# Patient Record
Sex: Male | Born: 1945
Health system: Southern US, Community
[De-identification: ages and names within clinical notes are randomized; demographics above are authoritative.]

## PROBLEM LIST (undated history)

## (undated) DIAGNOSIS — R0602 Shortness of breath: Secondary | ICD-10-CM

## (undated) DIAGNOSIS — K219 Gastro-esophageal reflux disease without esophagitis: Secondary | ICD-10-CM

## (undated) DIAGNOSIS — D649 Anemia, unspecified: Secondary | ICD-10-CM

## (undated) DIAGNOSIS — R42 Dizziness and giddiness: Secondary | ICD-10-CM

## (undated) DIAGNOSIS — H269 Unspecified cataract: Secondary | ICD-10-CM

## (undated) DIAGNOSIS — G2581 Restless legs syndrome: Secondary | ICD-10-CM

## (undated) DIAGNOSIS — J3089 Other allergic rhinitis: Secondary | ICD-10-CM

## (undated) DIAGNOSIS — E785 Hyperlipidemia, unspecified: Secondary | ICD-10-CM

## (undated) DIAGNOSIS — G4730 Sleep apnea, unspecified: Secondary | ICD-10-CM

## (undated) DIAGNOSIS — R519 Headache, unspecified: Secondary | ICD-10-CM

## (undated) DIAGNOSIS — M549 Dorsalgia, unspecified: Secondary | ICD-10-CM

## (undated) DIAGNOSIS — G43909 Migraine, unspecified, not intractable, without status migrainosus: Secondary | ICD-10-CM

## (undated) DIAGNOSIS — G4733 Obstructive sleep apnea (adult) (pediatric): Principal | ICD-10-CM

## (undated) DIAGNOSIS — K449 Diaphragmatic hernia without obstruction or gangrene: Secondary | ICD-10-CM

## (undated) DIAGNOSIS — I10 Essential (primary) hypertension: Secondary | ICD-10-CM

## (undated) DIAGNOSIS — G4700 Insomnia, unspecified: Secondary | ICD-10-CM

## (undated) DIAGNOSIS — R12 Heartburn: Secondary | ICD-10-CM

## (undated) DIAGNOSIS — F419 Anxiety disorder, unspecified: Secondary | ICD-10-CM

## (undated) DIAGNOSIS — M7989 Other specified soft tissue disorders: Secondary | ICD-10-CM

## (undated) DIAGNOSIS — IMO0001 Reserved for inherently not codable concepts without codable children: Secondary | ICD-10-CM

## (undated) HISTORY — PX: NASAL SINUS SURGERY: SHX719

## (undated) HISTORY — DX: Headache, unspecified: R51.9

## (undated) HISTORY — PX: TONSILLECTOMY AND ADENOIDECTOMY: SUR1326

## (undated) HISTORY — DX: Insomnia, unspecified: G47.00

## (undated) HISTORY — DX: Gastro-esophageal reflux disease without esophagitis: K21.9

## (undated) HISTORY — DX: Heartburn: R12

## (undated) HISTORY — DX: Dorsalgia, unspecified: M54.9

## (undated) HISTORY — DX: Sleep apnea, unspecified: G47.30

## (undated) HISTORY — DX: Other allergic rhinitis: J30.89

## (undated) HISTORY — DX: Other specified soft tissue disorders: M79.89

## (undated) HISTORY — DX: Essential (primary) hypertension: I10

## (undated) HISTORY — DX: Anxiety disorder, unspecified: F41.9

## (undated) HISTORY — DX: Diaphragmatic hernia without obstruction or gangrene: K44.9

## (undated) HISTORY — PX: FOREARM SURGERY: SHX651

## (undated) HISTORY — DX: Shortness of breath: R06.02

## (undated) HISTORY — DX: Hyperlipidemia, unspecified: E78.5

## (undated) HISTORY — DX: Reserved for inherently not codable concepts without codable children: IMO0001

## (undated) HISTORY — DX: Dizziness and giddiness: R42

## (undated) HISTORY — DX: Unspecified cataract: H26.9

## (undated) HISTORY — DX: Obstructive sleep apnea (adult) (pediatric): G47.33

## (undated) HISTORY — PX: ROTATOR CUFF REPAIR: SHX139

## (undated) HISTORY — DX: Anemia, unspecified: D64.9

## (undated) HISTORY — DX: Migraine, unspecified, not intractable, without status migrainosus: G43.909

## (undated) HISTORY — DX: Restless legs syndrome: G25.81

---

## 2012-11-19 ENCOUNTER — Other Ambulatory Visit: Payer: Self-pay | Admitting: Internal Medicine

## 2012-11-19 DIAGNOSIS — M7989 Other specified soft tissue disorders: Secondary | ICD-10-CM

## 2012-11-20 ENCOUNTER — Ambulatory Visit
Admission: RE | Admit: 2012-11-20 | Discharge: 2012-11-20 | Disposition: A | Discharge status: Home / Self Care | Payer: Medicare Other | Source: Ambulatory Visit | Attending: Internal Medicine | Admitting: Internal Medicine

## 2012-11-20 DIAGNOSIS — M7989 Other specified soft tissue disorders: Secondary | ICD-10-CM

## 2013-08-19 ENCOUNTER — Other Ambulatory Visit: Payer: Self-pay | Admitting: Internal Medicine

## 2013-08-19 DIAGNOSIS — Z139 Encounter for screening, unspecified: Secondary | ICD-10-CM

## 2013-08-22 ENCOUNTER — Other Ambulatory Visit: Payer: Medicare Other

## 2013-08-22 ENCOUNTER — Ambulatory Visit
Admission: RE | Admit: 2013-08-22 | Discharge: 2013-08-22 | Disposition: A | Discharge status: Home / Self Care | Payer: Medicare HMO | Source: Ambulatory Visit | Attending: Internal Medicine | Admitting: Internal Medicine

## 2013-08-22 DIAGNOSIS — Z139 Encounter for screening, unspecified: Secondary | ICD-10-CM

## 2013-10-28 ENCOUNTER — Other Ambulatory Visit: Payer: Self-pay | Admitting: Internal Medicine

## 2013-10-28 DIAGNOSIS — M7989 Other specified soft tissue disorders: Secondary | ICD-10-CM

## 2013-11-06 ENCOUNTER — Ambulatory Visit (INDEPENDENT_AMBULATORY_CARE_PROVIDER_SITE_OTHER): Payer: Medicare HMO | Admitting: Cardiology

## 2013-11-06 ENCOUNTER — Encounter: Payer: Self-pay | Admitting: Cardiology

## 2013-11-06 ENCOUNTER — Telehealth (HOSPITAL_COMMUNITY): Payer: Self-pay

## 2013-11-06 VITALS — BP 116/74 | HR 62 | Ht 72.0 in | Wt 189.1 lb

## 2013-11-06 DIAGNOSIS — R079 Chest pain, unspecified: Secondary | ICD-10-CM

## 2013-11-06 NOTE — Patient Instructions (Addendum)
The current medical regimen is effective;  continue present plan and medications.  Your physician has requested that you have a lexiscan myoview. For further information please visit [email or web address]. Please follow instruction sheet, as given.  Follow up will be based on these results 

## 2013-11-06 NOTE — Progress Notes (Signed)
HPI The patient presents for evaluation of chest discomfort. He has no past cardiac history although he does report a distant history of stress testing years ago. He reports that he's had chest discomfort over the past few days.  He reports that this happened Friday when he was doing yard work. It was warm. However, he says he had some excessive diaphoresis but he was doing. He subsequently had some chest discomfort. As a tightness. It was mid sternal. There was no radiation to his jaw or to his arms. He was somewhat short of breath. He stopped what he was doing. He did improve. He had another episode of this on Tuesday when he was golfing. He was much more diaphoretic he thought he should have been. There was some chest discomfort. He hasn't had any other than those 2 episodes. He has otherwise felt well. He was able to hit golf balls at the driving range on Monday which she thought was exerting and he did not get symptoms with this. He has never had these symptoms before. He has not had any resting complaints and denies any PND or orthopnea. He denies any palpitations, presyncope or syncope.   Current Outpatient Prescriptions  Medication Sig Dispense Refill  . atorvastatin (LIPITOR) 20 MG tablet Take 20 mg by mouth daily.      Marland Kitchen azelastine (ASTELIN) 0.1 % nasal spray Place 1 spray into both nostrils 2 (two) times daily. Use in each nostril as directed      . irbesartan (AVAPRO) 150 MG tablet Take 150 mg by mouth daily.      Marland Kitchen omeprazole (PRILOSEC) 20 MG capsule Take 20 mg by mouth every other day.      . pramipexole (MIRAPEX) 0.5 MG tablet Take 0.5 mg by mouth 2 (two) times daily.      . sertraline (ZOLOFT) 50 MG tablet Take 50 mg by mouth daily.       No current facility-administered medications for this visit.    Past Medical History  Diagnosis Date  . Hyperlipidemia   . Reflux     Past Surgical History  Procedure Laterality Date  . Rotator cuff repair      right  . Tonsillectomy and  adenoidectomy      Family History  Problem Relation Age of Onset  . Adopted: Yes    History   Social History  . Marital Status: Married    Spouse Name: N/A    Number of Children: 2  . Years of Education: N/A   Occupational History  . Not on file.   Social History Main Topics  . Smoking status: Never Smoker   . Smokeless tobacco: Not on file  . Alcohol Use: Not on file  . Drug Use: Not on file  . Sexual Activity: Not on file   Other Topics Concern  . Not on file   Social History Narrative   Retired.  Part time driver for [employer].  Lives at home with wife.     ROS:  Positive for occasional dizziness, vertigo, reflux. Otherwise as stated in the HPI and negative for all other systems.  PHYSICAL EXAM BP 116/74  Pulse 62  Ht 6' (1.829 m)  Wt 189 lb 1.9 oz (85.784 kg)  BMI 25.64 kg/m2 GENERAL:  Well appearing HEENT:  Pupils equal round and reactive, fundi not visualized, oral mucosa unremarkable NECK:  No jugular venous distention, waveform within normal limits, carotid upstroke brisk and symmetric, no bruits, no thyromegaly LYMPHATICS:  No  cervical, inguinal adenopathy LUNGS:  Clear to auscultation bilaterally BACK:  No CVA tenderness CHEST:  Unremarkable HEART:  PMI not displaced or sustained,S1 and S2 within normal limits, no S3, no S4, no clicks, no rubs, no murmurs ABD:  Flat, positive bowel sounds normal in frequency in pitch, no bruits, no rebound, no guarding, no midline pulsatile mass, no hepatomegaly, no splenomegaly EXT:  2 plus pulses throughout, no edema, no cyanosis no clubbing SKIN:  No rashes no nodules NEURO:  Cranial nerves II through XII grossly intact, motor grossly intact throughout PSYCH:  Cognitively intact, oriented to person place and time   EKG:  Sinus rhythm, rate 70,  axis within normal limits, intervals within normal limits, no acute ST-T wave changes.  11/05/2013  ASSESSMENT AND PLAN  CHEST PAIN:    The description of his discomfort is  concerning for obstructive coronary disease. He does not have severe risk factors although we don't know his family history. Given this the pretest probability of obstructive coronary disease is moderately high. Therefore, he needs to be added sensitivity and specificity of perfusion imaging. I will schedule this or an exercise echo.  The possibility for a false-negative exercise treadmill test alone he is too high and would put this patient danger of missing obstructive coronary disease.   HYPERLIPIDEMIA:  I do not have a would be happy to review these.  For now I will defer to No primary provider on file.

## 2013-11-07 ENCOUNTER — Encounter: Payer: Self-pay | Admitting: *Deleted

## 2013-11-07 ENCOUNTER — Encounter (HOSPITAL_COMMUNITY): Payer: Self-pay | Admitting: Pharmacy Technician

## 2013-11-07 ENCOUNTER — Telehealth: Payer: Self-pay | Admitting: Cardiology

## 2013-11-07 ENCOUNTER — Ambulatory Visit (HOSPITAL_COMMUNITY)
Admission: RE | Admit: 2013-11-07 | Discharge: 2013-11-07 | Disposition: A | Discharge status: Home / Self Care | Payer: Medicare HMO | Source: Ambulatory Visit | Attending: Cardiovascular Disease | Admitting: Cardiovascular Disease

## 2013-11-07 DIAGNOSIS — R079 Chest pain, unspecified: Secondary | ICD-10-CM

## 2013-11-07 DIAGNOSIS — Z01810 Encounter for preprocedural cardiovascular examination: Secondary | ICD-10-CM

## 2013-11-07 DIAGNOSIS — R9439 Abnormal result of other cardiovascular function study: Secondary | ICD-10-CM

## 2013-11-07 MED ORDER — TECHNETIUM TC 99M SESTAMIBI GENERIC - CARDIOLITE
30.9000 | Freq: Once | INTRAVENOUS | Status: AC | PRN
  Administered 2013-11-07: 30.9 via INTRAVENOUS

## 2013-11-07 MED ORDER — TECHNETIUM TC 99M SESTAMIBI GENERIC - CARDIOLITE
10.8000 | Freq: Once | INTRAVENOUS | Status: AC | PRN
  Administered 2013-11-07: 11 via INTRAVENOUS

## 2013-11-07 NOTE — Telephone Encounter (Signed)
New message     Had nuclear stress test today----can he go to the beach on Sunday even if he does not get the results back today?

## 2013-11-07 NOTE — Procedures (Addendum)
[facility] [address] [place] [address] [place] [address] [phone]  Cardiology Nuclear Med Study  [name] is a 68 y.o. male     MRN : [id number]     DOB: 1946/04/21  Procedure Date: 11/07/2013  Nuclear Med Background Indication for Stress Test:  Evaluation for Ischemia History:  No prior cardiac or respiratory history reported. No prior NUC MPI for comparison. Cardiac Risk Factors: Lipids  Symptoms:  Chest Pain, Dizziness, DOE, Light-Headedness and Palpitations   Nuclear Pre-Procedure Caffeine/Decaff Intake:  7:00pm NPO After: 5:00am   IV Site: R Forearm  IV 0.9% NS with Angio Cath:  22g  Chest Size (in):  46"  IV Started by: Rolene Course, RN  Height: 6' (1.829 m)  Cup Size: n/a  BMI:  Body mass index is 25.63 kg/(m^2). Weight:  189 lb (85.73 kg)   Tech Comments:  n/a    Nuclear Med Study 1 or 2 day study: 1 day  Stress Test Type:  Stress  Order Authorizing Provider:  Minus Breeding, MD   Resting Radionuclide: Technetium 95m Sestamibi  Resting Radionuclide Dose: 10.8 mCi   Stress Radionuclide:  Technetium 72m Sestamibi  Stress Radionuclide Dose: 30.9 mCi           Stress Protocol Rest HR: 67 Stress HR: 153  Rest BP: 113/83 Stress BP: 172/76  Exercise Time (min): 9:30 METS: 10.9   Predicted Max HR: 151 bpm % Max HR: 101.32 bpm Rate Pressure Product: 68127  Dose of Adenosine (mg):  n/a Dose of Lexiscan: n/a mg  Dose of Atropine (mg): n/a Dose of Dobutamine: n/a mcg/kg/min (at max HR)  Stress Test Technologist: Leane Para, CCT Nuclear Technologist: Imagene Riches, CNMT   Rest Procedure:  Myocardial perfusion imaging was performed at rest 45 minutes following the intravenous administration of Technetium 68m Sestamibi. Stress Procedure:  The patient performed treadmill exercise using a Bruce  Protocol for 9:30 minutes. The patient stopped due to SOB and Leg discomfort and denied any chest pain.  There were no  significant ST-T wave changes.  Technetium 42m Sestamibi was injected IV at peak exercise and myocardial perfusion imaging was performed after a brief delay.  Transient Ischemic Dilatation (Normal <1.22):  0.77 Lung/Heart Ratio (Normal <0.45):  0.22 QGS EDV:  85 ml QGS ESV:  34 ml LV Ejection Fraction: 60%  Rest ECG: NSR - Normal EKG  Stress ECG: Significant ST abnormalities consistent with ischemia. 1.5 mm horizontal inferolateral ST depression  QPS Raw Data Images:  Normal; no motion artifact; normal heart/lung ratio. Stress Images:  There is decreased uptake in the basal and mid segments of the inferior septum. Rest Images:  Normal homogeneous uptake in all areas of the myocardium. Subtraction (SDS):  These findings are consistent with ischemia. LV Wall Motion:  NL LV Function; NL Wall Motion  Impression Exercise Capacity:  Good exercise capacity. BP Response:  Normal blood pressure response. Clinical Symptoms:  No significant symptoms noted. ECG Impression:  Significant ST abnormalities consistent with ischemia. Comparison with Prior Nuclear Study: No previous nuclear study performed   Overall Impression:   Intermediate risk stress nuclear study with a relatively small area of mild inferoseptal ischemia.    Sanda Klein, MD  11/07/2013 11:45 AM

## 2013-11-07 NOTE — Telephone Encounter (Signed)
Reviewed results of nuc study with Dr Percival Spanish - pt needs a left heart cath one day next week.  Pt is aware.  He will not go to the beach and is aware I will call back with a date,time and instructions for the heart cath.

## 2013-11-07 NOTE — Telephone Encounter (Signed)
Pt aware he has been scheduled for a cardiac cath on Tuesday at [facility].  He will come into the office to pick up instructions and to have blood work.

## 2013-11-09 ENCOUNTER — Emergency Department (HOSPITAL_COMMUNITY): Payer: Medicare HMO

## 2013-11-09 ENCOUNTER — Emergency Department (HOSPITAL_COMMUNITY)
Admission: EM | Admit: 2013-11-09 | Discharge: 2013-11-09 | Disposition: A | Discharge status: Home / Self Care | Payer: Medicare HMO | Attending: Emergency Medicine | Admitting: Emergency Medicine

## 2013-11-09 ENCOUNTER — Telehealth: Payer: Self-pay | Admitting: Physician Assistant

## 2013-11-09 ENCOUNTER — Encounter (HOSPITAL_COMMUNITY): Payer: Self-pay | Admitting: Emergency Medicine

## 2013-11-09 DIAGNOSIS — R002 Palpitations: Secondary | ICD-10-CM | POA: Insufficient documentation

## 2013-11-09 DIAGNOSIS — R42 Dizziness and giddiness: Secondary | ICD-10-CM | POA: Insufficient documentation

## 2013-11-09 DIAGNOSIS — R55 Syncope and collapse: Secondary | ICD-10-CM

## 2013-11-09 DIAGNOSIS — E785 Hyperlipidemia, unspecified: Secondary | ICD-10-CM | POA: Insufficient documentation

## 2013-11-09 DIAGNOSIS — IMO0002 Reserved for concepts with insufficient information to code with codable children: Secondary | ICD-10-CM | POA: Insufficient documentation

## 2013-11-09 DIAGNOSIS — R5381 Other malaise: Secondary | ICD-10-CM | POA: Insufficient documentation

## 2013-11-09 DIAGNOSIS — R5383 Other fatigue: Secondary | ICD-10-CM

## 2013-11-09 DIAGNOSIS — Z79899 Other long term (current) drug therapy: Secondary | ICD-10-CM | POA: Insufficient documentation

## 2013-11-09 DIAGNOSIS — K219 Gastro-esophageal reflux disease without esophagitis: Secondary | ICD-10-CM | POA: Insufficient documentation

## 2013-11-09 LAB — CBC
HCT: 37.8 % — ABNORMAL LOW (ref 39.0–52.0)
Hemoglobin: 13 g/dL (ref 13.0–17.0)
MCH: 32.3 pg (ref 26.0–34.0)
MCHC: 34.4 g/dL (ref 30.0–36.0)
MCV: 94 fL (ref 78.0–100.0)
Platelets: 262 10*3/uL (ref 150–400)
RBC: 4.02 MIL/uL — ABNORMAL LOW (ref 4.22–5.81)
RDW: 12.8 % (ref 11.5–15.5)
WBC: 5.7 10*3/uL (ref 4.0–10.5)

## 2013-11-09 LAB — BASIC METABOLIC PANEL
BUN: 40 mg/dL — AB (ref 6–23)
CALCIUM: 9.3 mg/dL (ref 8.4–10.5)
CO2: 27 mEq/L (ref 19–32)
CREATININE: 1.4 mg/dL — AB (ref 0.50–1.35)
Chloride: 100 mEq/L (ref 96–112)
GFR, EST AFRICAN AMERICAN: 58 mL/min — AB (ref >90)
GFR, EST NON AFRICAN AMERICAN: 50 mL/min — AB (ref >90)
Glucose, Bld: 87 mg/dL (ref 70–99)
POTASSIUM: 3.9 meq/L (ref 3.7–5.3)
Sodium: 141 mEq/L (ref 137–147)

## 2013-11-09 LAB — TROPONIN I: Troponin I: <0.3 ng/mL (ref <0.30)

## 2013-11-09 LAB — I-STAT TROPONIN, ED: Troponin i, poc: 0 ng/mL (ref 0.00–0.08)

## 2013-11-09 MED ORDER — SODIUM CHLORIDE 0.9 % IV BOLUS (SEPSIS)
1000.0000 mL | Freq: Once | INTRAVENOUS | Status: AC
  Administered 2013-11-09: 1000 mL via INTRAVENOUS

## 2013-11-09 NOTE — Telephone Encounter (Signed)
The patient called to report that while at church he became dizzy.  This lasted for about an hour and resolved.  BP while we spoke was 117/87.   He had no CP or SOB during this period.  He has a LHC scheduled for Tuesday because a Positive Nuc last week(intermdeiate risk).  EF on the study was 60%.  I suggested he go to the ER if it happens again and also repeat his BP.  Tarri Fuller, [facility]

## 2013-11-09 NOTE — Discharge Instructions (Signed)
Near-Syncope Followup on Tuesday as scheduled for catheterization. Cut your avalide dose in half.  Return to the ED if you develop new or worsening symptoms. Near-syncope (commonly known as near fainting) is sudden weakness, dizziness, or feeling like you might pass out. During an episode of near-syncope, you may also develop pale skin, have tunnel vision, or feel sick to your stomach (nauseous). Near-syncope may occur when getting up after sitting or while standing for a long time. It is caused by a sudden decrease in blood flow to the brain. This decrease can result from various causes or triggers, most of which are not serious. However, because near-syncope can sometimes be a sign of something serious, a medical evaluation is required. The specific cause is often not determined. HOME CARE INSTRUCTIONS  Monitor your condition for any changes. The following actions may help to alleviate any discomfort you are experiencing:  Have someone stay with you until you feel stable.  Lie down right away if you start feeling like you might faint. Breathe deeply and steadily. Wait until all the symptoms have passed. Most of these episodes last only a few minutes. You may feel tired for several hours.   Drink enough fluids to keep your urine clear or pale yellow.   If you are taking blood pressure or heart medicine, get up slowly when seated or lying down. Take several minutes to sit and then stand. This can reduce dizziness.  Follow up with your health care provider as directed. SEEK IMMEDIATE MEDICAL CARE IF:   You have a severe headache.   You have unusual pain in the chest, abdomen, or back.   You are bleeding from the mouth or rectum, or you have black or tarry stool.   You have an irregular or very fast heartbeat.   You have repeated fainting or have seizure-like jerking during an episode.   You faint when sitting or lying down.   You have confusion.   You have difficulty walking.    You have severe weakness.   You have vision problems.  MAKE SURE YOU:   Understand these instructions.  Will watch your condition.  Will get help right away if you are not doing well or get worse. Document Released: 06/12/2005 Document Revised: 02/12/2013 Document Reviewed: 11/15/2012 [facility] Patient Information 2014 [place].

## 2013-11-09 NOTE — ED Notes (Signed)
Pt reports that he has a cardiac cath scheduled on tues, has episodes of feeling very lightheaded and palpitations. Had episode this am that lasted longer than normal but resolved pta. HR 82 at triage, ekg done.

## 2013-11-09 NOTE — ED Notes (Signed)
Pt states he was at Sunday school today when the lightheaded and dizziness with associated chest fluttering started.  The episode lasted for approximately 30 minutes and has passed.  Pt denies pain or tingling down either extremity, no nausea or vomiting and no back pains.  Pt is to be Catheterized this Tuesday for a blockage on the back side of his heart.  Wife at bedside.

## 2013-11-09 NOTE — ED Notes (Signed)
Patient transported to x-ray. ?

## 2013-11-09 NOTE — ED Provider Notes (Signed)
CSN: [id number]     Arrival date & time 11/09/13  1337 History   First MD Initiated Contact with Patient 11/09/13 1457     Chief Complaint  Patient presents with  . Weakness  . Palpitations     (Consider location/radiation/quality/duration/timing/severity/associated sxs/prior Treatment) HPI Comments: Patient presents with lightheadedness and near syncopal episode while standing in church today while talking. Is associated with lightheadedness, dizziness and palpitations. This improved with sitting down. It resolved after about one hour. He denies any chest pain or shortness of breath. Denies any diaphoresis, nausea or vomiting. Patient is scheduled for cardiac catheterization in 2 days due to an abnormal stress test. He said similar episodes of lightheadedness in the past but they're usually associated with diaphoresis and chest pain. No chest discomfort or diaphoresis today. His wife urged him to seek evaluation today after calling his cardiologist. Patient feels back to baseline now. Denies feeling overheated. Denies skipping any meals. Denies standing for prolonged period of time.  The history is provided by the patient and the spouse.    Past Medical History  Diagnosis Date  . Hyperlipidemia   . Reflux    Past Surgical History  Procedure Laterality Date  . Rotator cuff repair      right  . Tonsillectomy and adenoidectomy     Family History  Problem Relation Age of Onset  . Adopted: Yes   History  Substance Use Topics  . Smoking status: Never Smoker   . Smokeless tobacco: Not on file  . Alcohol Use: Not on file    Review of Systems  Constitutional: Negative for fever, activity change, appetite change and fatigue.  HENT: Negative for rhinorrhea.   Respiratory: Negative for cough, chest tightness and shortness of breath.   Cardiovascular: Positive for palpitations. Negative for chest pain.  Gastrointestinal: Negative for nausea, vomiting and abdominal pain.   Genitourinary: Negative for dysuria and hematuria.  Musculoskeletal: Negative for arthralgias and myalgias.  Neurological: Positive for dizziness, weakness and light-headedness. Negative for headaches.  A complete 10 system review of systems was obtained and all systems are negative except as noted in the HPI and PMH.      Allergies  Review of patient's allergies indicates no known allergies.  Home Medications   Prior to Admission medications   Medication Sig Start Date End Date Taking? Authorizing Provider  aspirin EC 81 MG tablet Take 81 mg by mouth daily.   Yes Historical Provider, MD  atorvastatin (LIPITOR) 20 MG tablet Take 20 mg by mouth daily.   Yes Historical Provider, MD  azelastine (ASTELIN) 0.1 % nasal spray Place 1 spray into both nostrils daily. Use in each nostril as directed   Yes Historical Provider, MD  fluticasone (FLONASE) 50 MCG/ACT nasal spray Place 1 spray into both nostrils daily.   Yes Historical Provider, MD  irbesartan-hydrochlorothiazide (AVALIDE) 150-12.5 MG per tablet Take 1 tablet by mouth daily.   Yes Historical Provider, MD  Magnesium 500 MG CAPS Take 1,000 mg by mouth daily.   Yes Historical Provider, MD  omeprazole (PRILOSEC) 20 MG capsule Take 20 mg by mouth every other day.   Yes Historical Provider, MD  pramipexole (MIRAPEX) 0.5 MG tablet Take 1 mg by mouth daily.    Yes Historical Provider, MD  sertraline (ZOLOFT) 50 MG tablet Take 25 mg by mouth daily.    Yes Historical Provider, MD  meloxicam (MOBIC) 7.5 MG tablet Take 7.5 mg by mouth daily as needed for pain.    Historical Provider,  MD  traMADol (ULTRAM) 50 MG tablet Take 50 mg by mouth every 6 (six) hours as needed.    Historical Provider, MD   BP 128/68  Pulse 65  Temp(Src) 97.5 F (36.4 C) (Oral)  Resp 13  Ht 6' (1.829 m)  Wt 189 lb (85.73 kg)  BMI 25.63 kg/m2  SpO2 99% Physical Exam  Constitutional: He is oriented to person, place, and time. He appears well-developed and  well-nourished. No distress.  HENT:  Head: Normocephalic and atraumatic.  Mouth/Throat: Oropharynx is clear and moist. No oropharyngeal exudate.  Eyes: Conjunctivae and EOM are normal. Pupils are equal, round, and reactive to light.  Neck: Normal range of motion. Neck supple.  Cardiovascular: Normal rate, regular rhythm, normal heart sounds and intact distal pulses.   No murmur heard. Pulmonary/Chest: Effort normal and breath sounds normal. No respiratory distress.  Abdominal: Soft. There is no tenderness. There is no rebound and no guarding.  Musculoskeletal: Normal range of motion. He exhibits no edema and no tenderness.  Neurological: He is alert and oriented to person, place, and time. No cranial nerve deficit. He exhibits normal muscle tone. Coordination normal.  CN 2-12 intact, no ataxia on finger to nose, no nystagmus, 5/5 strength throughout, no pronator drift, Romberg negative, normal gait.   Skin: Skin is warm.    ED Course  Procedures (including critical care time) Labs Review Labs Reviewed  CBC - Abnormal; Notable for the following:    RBC 4.02 (*)    HCT 37.8 (*)    All other components within normal limits  BASIC METABOLIC PANEL - Abnormal; Notable for the following:    BUN 40 (*)    Creatinine, Ser 1.40 (*)    GFR calc non Af Amer 50 (*)    GFR calc Af Amer 58 (*)    All other components within normal limits  TROPONIN I  Randolm Idol, ED    Imaging Review Dg Chest 2 View  11/09/2013   CLINICAL DATA:  Cardiac palpitations  EXAM: CHEST  2 VIEW  COMPARISON:  None.  FINDINGS: Lungs are clear. Heart size and pulmonary vascularity are normal. No adenopathy. There is mild degenerative change in the thoracic spine.  IMPRESSION: No edema or consolidation.   Electronically Signed   By: Lowella Grip M.D.   On: 11/09/2013 15:57     EKG Interpretation   Date/Time:  Sunday Nov 09 2013 13:44:25 EDT Ventricular Rate:  82 PR Interval:  166 QRS Duration: 100 QT  Interval:  378 QTC Calculation: 441 R Axis:   51 Text Interpretation:  Normal sinus rhythm Normal ECG No previous ECGs  available Confirmed by Wyvonnia Dusky  MD, Reggie Bise [phone]) on 11/09/2013 2:57:08  PM      MDM   Final diagnoses:  Near syncope  Palpitations   Near syncopal episode with lightheadedness and palpitations while standing in church. Resolved. No chest pain or shortness of breath. Similar episodes in the past and scheduled for a catheterization in 2 days.  EKG normal sinus rhythm without ST changes. Troponin negative. Creatinine 1.4 with no comparison.   Discussed with Dr. Marlou Porch of cardiology who feels patient is stable for discharge after hydration.  willl hydrate and advised him to cut his Avalide dose in half.  Patient will followup this week for his catheterization as scheduled. Return precautions discussed.  Ezequiel Essex, MD 11/10/13 [phone]

## 2013-11-09 NOTE — ED Notes (Signed)
Dr. Rancour at bedside. 

## 2013-11-10 ENCOUNTER — Other Ambulatory Visit: Payer: Medicare HMO

## 2013-11-10 ENCOUNTER — Other Ambulatory Visit: Payer: Self-pay | Admitting: Cardiology

## 2013-11-10 DIAGNOSIS — R9430 Abnormal result of cardiovascular function study, unspecified: Secondary | ICD-10-CM

## 2013-11-10 NOTE — Telephone Encounter (Signed)
Spoke with pt and answered all questions.  Per Dr Percival Spanish pt was in the ED yesterday and blood work was done.  He does not need to come in for more blood work.  Reviewed all instruction with pt who states understanding.  He will call back with further questions or concerns.

## 2013-11-10 NOTE — Telephone Encounter (Signed)
New message    Patient has question concerning cath procedure on tomorrow.

## 2013-11-11 ENCOUNTER — Ambulatory Visit (HOSPITAL_COMMUNITY)
Admission: RE | Admit: 2013-11-11 | Discharge: 2013-11-11 | Disposition: A | Discharge status: Home / Self Care | Payer: Medicare HMO | Source: Ambulatory Visit | Attending: Cardiology | Admitting: Cardiology

## 2013-11-11 ENCOUNTER — Encounter (HOSPITAL_COMMUNITY)
Admission: RE | Disposition: A | Discharge status: Home / Self Care | Payer: Self-pay | Source: Ambulatory Visit | Attending: Cardiology

## 2013-11-11 DIAGNOSIS — E785 Hyperlipidemia, unspecified: Secondary | ICD-10-CM | POA: Insufficient documentation

## 2013-11-11 DIAGNOSIS — R9430 Abnormal result of cardiovascular function study, unspecified: Secondary | ICD-10-CM

## 2013-11-11 DIAGNOSIS — R079 Chest pain, unspecified: Secondary | ICD-10-CM

## 2013-11-11 DIAGNOSIS — K219 Gastro-esophageal reflux disease without esophagitis: Secondary | ICD-10-CM | POA: Insufficient documentation

## 2013-11-11 HISTORY — PX: LEFT HEART CATHETERIZATION WITH CORONARY ANGIOGRAM: SHX5451

## 2013-11-11 LAB — PROTIME-INR
INR: 1.04 (ref 0.00–1.49)
Prothrombin Time: 13.4 seconds (ref 11.6–15.2)

## 2013-11-11 SURGERY — LEFT HEART CATHETERIZATION WITH CORONARY ANGIOGRAM
Anesthesia: LOCAL

## 2013-11-11 MED ORDER — SODIUM CHLORIDE 0.9 % IV SOLN: 1.0000 mL/kg/h | INTRAVENOUS | Status: DC

## 2013-11-11 MED ORDER — HEPARIN (PORCINE) IN NACL 2-0.9 UNIT/ML-% IJ SOLN
INTRAMUSCULAR | Status: AC
  Filled 2013-11-11: qty 1000

## 2013-11-11 MED ORDER — SODIUM CHLORIDE 0.9 % IV SOLN
INTRAVENOUS | Status: DC
  Administered 2013-11-11: 06:00:00 via INTRAVENOUS

## 2013-11-11 MED ORDER — HEPARIN (PORCINE) IN NACL 2-0.9 UNIT/ML-% IJ SOLN
INTRAMUSCULAR | Status: AC
  Filled 2013-11-11: qty 500

## 2013-11-11 MED ORDER — HEPARIN SODIUM (PORCINE) 1000 UNIT/ML IJ SOLN
INTRAMUSCULAR | Status: AC
  Filled 2013-11-11: qty 1

## 2013-11-11 MED ORDER — VERAPAMIL HCL 2.5 MG/ML IV SOLN
INTRAVENOUS | Status: AC
  Filled 2013-11-11: qty 2

## 2013-11-11 MED ORDER — SODIUM CHLORIDE 0.9 % IJ SOLN: 3.0000 mL | Freq: Two times a day (BID) | INTRAMUSCULAR | Status: DC

## 2013-11-11 MED ORDER — SODIUM CHLORIDE 0.9 % IJ SOLN: 3.0000 mL | INTRAMUSCULAR | Status: DC | PRN

## 2013-11-11 MED ORDER — LIDOCAINE HCL (PF) 1 % IJ SOLN
INTRAMUSCULAR | Status: AC
  Filled 2013-11-11: qty 30

## 2013-11-11 MED ORDER — ASPIRIN 81 MG PO CHEW
CHEWABLE_TABLET | ORAL | Status: AC
  Administered 2013-11-11: 81 mg via ORAL
  Filled 2013-11-11: qty 1

## 2013-11-11 MED ORDER — FENTANYL CITRATE 0.05 MG/ML IJ SOLN
INTRAMUSCULAR | Status: AC
  Filled 2013-11-11: qty 2

## 2013-11-11 MED ORDER — MIDAZOLAM HCL 2 MG/2ML IJ SOLN
INTRAMUSCULAR | Status: AC
  Filled 2013-11-11: qty 2

## 2013-11-11 MED ORDER — SODIUM CHLORIDE 0.9 % IV SOLN: 250.0000 mL | INTRAVENOUS | Status: DC | PRN

## 2013-11-11 MED ORDER — NITROGLYCERIN 0.2 MG/ML ON CALL CATH LAB
INTRAVENOUS | Status: AC
  Filled 2013-11-11: qty 1

## 2013-11-11 MED ORDER — ASPIRIN 81 MG PO CHEW
81.0000 mg | CHEWABLE_TABLET | ORAL | Status: AC
  Administered 2013-11-11: 81 mg via ORAL

## 2013-11-11 NOTE — CV Procedure (Signed)
    Cardiac Catheterization Procedure Note  Name: Adrian Skinner MRN: [id number] DOB: 11-Jan-1946  Procedure: Left Heart Cath, Selective Coronary Angiography, LV angiography  Indication: 68 yo WM with history of hyperlipidemia presents with recent onset chest pain. Stress myoview was intermediate risk with inferoseptal ischemia.   Procedural Details: The right wrist was prepped, draped, and anesthetized with 1% lidocaine. Using the modified Seldinger technique, a 5 French sheath was introduced into the right radial artery. 3 mg of verapamil was administered through the sheath, weight-based unfractionated heparin was administered intravenously. Standard Judkins catheters were used for selective coronary angiography and left ventriculography. Catheter exchanges were performed over an exchange length guidewire. There were no immediate procedural complications. A TR band was used for radial hemostasis at the completion of the procedure.  The patient was transferred to the post catheterization recovery area for further monitoring.  Procedural Findings: Hemodynamics: AO 115/59 mean 85 mm Hg LV 116/11 mm Hg  Coronary angiography: Coronary dominance: right  Left mainstem: Normal   Left anterior descending (LAD): 10-20% mid vessel.  Left circumflex (LCx): Normal  Right coronary artery (RCA): Normal  Left ventriculography: Left ventricular systolic function is normal, LVEF is estimated at 55-65%, there is no significant mitral regurgitation   Final Conclusions:   1. No significant coronary artery disease. 2. Normal LV function.  Recommendations: Consider alternative causes of chest pain.  Ander Slade [facility] 11/11/2013, 8:07 AM

## 2013-11-11 NOTE — Progress Notes (Signed)
RT wrist TRB removed. Tegaderm dsg applied. Site level 0.

## 2013-11-11 NOTE — Interval H&P Note (Signed)
History and Physical Interval Note:  11/11/2013 7:44 AM  Adrian Skinner  has presented today for surgery, with the diagnosis of cp, positive nuclear study  The various methods of treatment have been discussed with the patient and family. After consideration of risks, benefits and other options for treatment, the patient has consented to  Procedure(s): LEFT HEART CATHETERIZATION WITH CORONARY ANGIOGRAM (N/A) as a surgical intervention .  The patient's history has been reviewed, patient examined, no change in status, stable for surgery.  I have reviewed the patient's chart and labs.  Questions were answered to the patient's satisfaction.   Cath Lab Visit (complete for each Cath Lab visit)  Clinical Evaluation Leading to the Procedure:   ACS: no  Non-ACS:    Anginal Classification: CCS III  Anti-ischemic medical therapy: No Therapy  Non-Invasive Test Results: Intermediate-risk stress test findings: cardiac mortality 1-3%/year  Prior CABG: No previous CABG        Ander Slade [facility] 11/11/2013 7:44 AM

## 2013-11-11 NOTE — Discharge Instructions (Signed)

## 2013-11-11 NOTE — H&P (View-Only) (Signed)
HPI The patient presents for evaluation of chest discomfort. He has no past cardiac history although he does report a distant history of stress testing years ago. He reports that he's had chest discomfort over the past few days.  He reports that this happened Friday when he was doing yard work. It was warm. However, he says he had some excessive diaphoresis but he was doing. He subsequently had some chest discomfort. As a tightness. It was mid sternal. There was no radiation to his jaw or to his arms. He was somewhat short of breath. He stopped what he was doing. He did improve. He had another episode of this on Tuesday when he was golfing. He was much more diaphoretic he thought he should have been. There was some chest discomfort. He hasn't had any other than those 2 episodes. He has otherwise felt well. He was able to hit golf balls at the driving range on Monday which she thought was exerting and he did not get symptoms with this. He has never had these symptoms before. He has not had any resting complaints and denies any PND or orthopnea. He denies any palpitations, presyncope or syncope.   Current Outpatient Prescriptions  Medication Sig Dispense Refill  . atorvastatin (LIPITOR) 20 MG tablet Take 20 mg by mouth daily.      Marland Kitchen azelastine (ASTELIN) 0.1 % nasal spray Place 1 spray into both nostrils 2 (two) times daily. Use in each nostril as directed      . irbesartan (AVAPRO) 150 MG tablet Take 150 mg by mouth daily.      Marland Kitchen omeprazole (PRILOSEC) 20 MG capsule Take 20 mg by mouth every other day.      . pramipexole (MIRAPEX) 0.5 MG tablet Take 0.5 mg by mouth 2 (two) times daily.      . sertraline (ZOLOFT) 50 MG tablet Take 50 mg by mouth daily.       No current facility-administered medications for this visit.    Past Medical History  Diagnosis Date  . Hyperlipidemia   . Reflux     Past Surgical History  Procedure Laterality Date  . Rotator cuff repair      right  . Tonsillectomy and  adenoidectomy      Family History  Problem Relation Age of Onset  . Adopted: Yes    History   Social History  . Marital Status: Married    Spouse Name: N/A    Number of Children: 2  . Years of Education: N/A   Occupational History  . Not on file.   Social History Main Topics  . Smoking status: Never Smoker   . Smokeless tobacco: Not on file  . Alcohol Use: Not on file  . Drug Use: Not on file  . Sexual Activity: Not on file   Other Topics Concern  . Not on file   Social History Narrative   Retired.  Part time driver for [employer].  Lives at home with wife.     ROS:  Positive for occasional dizziness, vertigo, reflux. Otherwise as stated in the HPI and negative for all other systems.  PHYSICAL EXAM BP 116/74  Pulse 62  Ht 6' (1.829 m)  Wt 189 lb 1.9 oz (85.784 kg)  BMI 25.64 kg/m2 GENERAL:  Well appearing HEENT:  Pupils equal round and reactive, fundi not visualized, oral mucosa unremarkable NECK:  No jugular venous distention, waveform within normal limits, carotid upstroke brisk and symmetric, no bruits, no thyromegaly LYMPHATICS:  No  cervical, inguinal adenopathy LUNGS:  Clear to auscultation bilaterally BACK:  No CVA tenderness CHEST:  Unremarkable HEART:  PMI not displaced or sustained,S1 and S2 within normal limits, no S3, no S4, no clicks, no rubs, no murmurs ABD:  Flat, positive bowel sounds normal in frequency in pitch, no bruits, no rebound, no guarding, no midline pulsatile mass, no hepatomegaly, no splenomegaly EXT:  2 plus pulses throughout, no edema, no cyanosis no clubbing SKIN:  No rashes no nodules NEURO:  Cranial nerves II through XII grossly intact, motor grossly intact throughout PSYCH:  Cognitively intact, oriented to person place and time   EKG:  Sinus rhythm, rate 70,  axis within normal limits, intervals within normal limits, no acute ST-T wave changes.  11/05/2013  ASSESSMENT AND PLAN  CHEST PAIN:    The description of his discomfort is  concerning for obstructive coronary disease. He does not have severe risk factors although we don't know his family history. Given this the pretest probability of obstructive coronary disease is moderately high. Therefore, he needs to be added sensitivity and specificity of perfusion imaging. I will schedule this or an exercise echo.  The possibility for a false-negative exercise treadmill test alone he is too high and would put this patient danger of missing obstructive coronary disease.   HYPERLIPIDEMIA:  I do not have a would be happy to review these.  For now I will defer to No primary provider on file.

## 2013-12-31 ENCOUNTER — Ambulatory Visit: Payer: Medicare HMO | Attending: Sports Medicine | Admitting: Physical Therapy

## 2013-12-31 DIAGNOSIS — M25676 Stiffness of unspecified foot, not elsewhere classified: Secondary | ICD-10-CM | POA: Insufficient documentation

## 2013-12-31 DIAGNOSIS — IMO0001 Reserved for inherently not codable concepts without codable children: Secondary | ICD-10-CM | POA: Diagnosis not present

## 2013-12-31 DIAGNOSIS — M722 Plantar fascial fibromatosis: Secondary | ICD-10-CM | POA: Insufficient documentation

## 2013-12-31 DIAGNOSIS — R5381 Other malaise: Secondary | ICD-10-CM | POA: Diagnosis not present

## 2013-12-31 DIAGNOSIS — M25673 Stiffness of unspecified ankle, not elsewhere classified: Secondary | ICD-10-CM | POA: Diagnosis not present

## 2013-12-31 DIAGNOSIS — M25579 Pain in unspecified ankle and joints of unspecified foot: Secondary | ICD-10-CM | POA: Insufficient documentation

## 2014-01-06 ENCOUNTER — Ambulatory Visit: Payer: Medicare HMO | Admitting: Physical Therapy

## 2014-01-07 ENCOUNTER — Ambulatory Visit: Payer: Medicare HMO | Admitting: Physical Therapy

## 2014-01-07 DIAGNOSIS — IMO0001 Reserved for inherently not codable concepts without codable children: Secondary | ICD-10-CM | POA: Diagnosis not present

## 2014-01-08 NOTE — Telephone Encounter (Signed)
Encounter complete. 

## 2014-01-09 ENCOUNTER — Ambulatory Visit: Payer: Medicare HMO | Admitting: Interventional Cardiology

## 2014-06-04 ENCOUNTER — Encounter (HOSPITAL_COMMUNITY): Payer: Self-pay | Admitting: Cardiology

## 2014-11-05 ENCOUNTER — Institutional Professional Consult (permissible substitution): Payer: Medicare HMO | Admitting: Pulmonary Disease

## 2014-11-17 ENCOUNTER — Ambulatory Visit (HOSPITAL_BASED_OUTPATIENT_CLINIC_OR_DEPARTMENT_OTHER): Payer: PPO | Attending: Internal Medicine

## 2014-11-17 VITALS — Ht 72.0 in | Wt 195.0 lb

## 2014-11-17 DIAGNOSIS — G4733 Obstructive sleep apnea (adult) (pediatric): Secondary | ICD-10-CM | POA: Diagnosis not present

## 2014-11-17 DIAGNOSIS — R0683 Snoring: Secondary | ICD-10-CM | POA: Diagnosis not present

## 2014-11-17 DIAGNOSIS — G4710 Hypersomnia, unspecified: Secondary | ICD-10-CM | POA: Diagnosis present

## 2014-11-20 ENCOUNTER — Encounter: Payer: Self-pay | Admitting: Pulmonary Disease

## 2014-11-20 ENCOUNTER — Ambulatory Visit (INDEPENDENT_AMBULATORY_CARE_PROVIDER_SITE_OTHER): Payer: PPO | Admitting: Pulmonary Disease

## 2014-11-20 VITALS — BP 116/80 | HR 74 | Ht 72.0 in | Wt 198.2 lb

## 2014-11-20 DIAGNOSIS — G4722 Circadian rhythm sleep disorder, advanced sleep phase type: Secondary | ICD-10-CM

## 2014-11-20 DIAGNOSIS — R0683 Snoring: Secondary | ICD-10-CM

## 2014-11-20 NOTE — Progress Notes (Deleted)
   Subjective:    Patient ID: NIZAR CUTLER, male    DOB: [birth year], 69 y.o.   MRN: [id number]  HPI    Review of Systems  Constitutional: Positive for unexpected weight change. Negative for fever.  HENT: Negative for congestion, dental problem, ear pain, nosebleeds, postnasal drip, rhinorrhea, sinus pressure, sneezing, sore throat and trouble swallowing.   Eyes: Negative for redness and itching.  Respiratory: Negative for cough, chest tightness, shortness of breath and wheezing.   Cardiovascular: Negative for palpitations and leg swelling.  Gastrointestinal: Negative for nausea and vomiting.       Acid heartburn  Genitourinary: Negative for dysuria.  Musculoskeletal: Negative for joint swelling.  Skin: Negative for rash.  Neurological: Negative for headaches.  Hematological: Does not bruise/bleed easily.  Psychiatric/Behavioral: Negative for dysphoric mood. The patient is not nervous/anxious.        Objective:   Physical Exam        Assessment & Plan:

## 2014-11-20 NOTE — Progress Notes (Signed)
Chief Complaint  Patient presents with  . SLEEP CONSULT    Referred by Dr Shelia Media. Sleep study 11/17/14 @ [facility]. Minimal sleep during the night. Epworth Score: 5    History of Present Illness: Adrian Skinner is a 69 y.o. male for evaluation of sleep problems.  Since he semi retired he has noticed a change in his sleep pattern.  He feels very sleepy in the evening and will fall asleep at about 8 pm while watching TV.  He will sleep for about an hour.  He then goes to bed at 10 pm.  He has been taking melatonin for the past one month >> this hasn't made much difference.  He sleeps through until about 2 am >> he wakes up to use the bathroom.  He can sometimes go back to sleep until about 4 am.  He gets out of bed then.  He does not feel tired and maintains his function during the day.  He snores, and has trouble sleeping on his back.  His mouth gets dry at night.  He had sleep study on 11/17/14 > results pending.  He denies sleep walking, sleep talking, bruxism, or nightmares.  There is no history of restless legs.  He denies sleep hallucinations, sleep paralysis, or cataplexy.  The Epworth score is 5 out of 24.  Tests:   Adrian Skinner  has a past medical history of Hyperlipidemia and Reflux.  Adrian Skinner  has past surgical history that includes Rotator cuff repair; Tonsillectomy and adenoidectomy; and left heart catheterization with coronary angiogram (N/A, 11/11/2013).  Prior to Admission medications   Medication Sig Start Date End Date Taking? Authorizing Provider  aspirin EC 81 MG tablet Take 81 mg by mouth daily.   Yes Historical Provider, MD  atorvastatin (LIPITOR) 20 MG tablet Take 20 mg by mouth daily.   Yes Historical Provider, MD  fluticasone (FLONASE) 50 MCG/ACT nasal spray Place 1 spray into both nostrils daily.   Yes Historical Provider, MD  irbesartan-hydrochlorothiazide (AVALIDE) 150-12.5 MG per tablet Take 1 tablet by mouth daily.   Yes Historical Provider, MD  Magnesium 500 MG  CAPS Take 1,000 mg by mouth daily.   Yes Historical Provider, MD  meloxicam (MOBIC) 7.5 MG tablet Take 7.5 mg by mouth daily as needed for pain.   Yes Historical Provider, MD  omeprazole (PRILOSEC) 20 MG capsule Take 20 mg by mouth daily.    Yes Historical Provider, MD  pramipexole (MIRAPEX) 0.5 MG tablet Take 1 mg by mouth daily.    Yes Historical Provider, MD  sertraline (ZOLOFT) 50 MG tablet Take 25 mg by mouth daily.    Yes Historical Provider, MD  traMADol (ULTRAM) 50 MG tablet Take 50 mg by mouth every 6 (six) hours as needed.   Yes Historical Provider, MD    No Known Allergies  His family history is not on file. He was adopted.  He  reports that he has never smoked. He does not have any smokeless tobacco history on file. He reports that he drinks alcohol. He reports that he does not use illicit drugs.  Review of Systems  Constitutional: Positive for unexpected weight change. Negative for fever.  HENT: Negative for congestion, dental problem, ear pain, nosebleeds, postnasal drip, rhinorrhea, sinus pressure, sneezing, sore throat and trouble swallowing.   Eyes: Negative for redness and itching.  Respiratory: Negative for cough, chest tightness, shortness of breath and wheezing.   Cardiovascular: Negative for palpitations and leg swelling.  Gastrointestinal: Negative  for nausea and vomiting.       Acid heartburn  Genitourinary: Negative for dysuria.  Musculoskeletal: Negative for joint swelling.  Skin: Negative for rash.  Neurological: Negative for headaches.  Hematological: Does not bruise/bleed easily.  Psychiatric/Behavioral: Negative for dysphoric mood. The patient is not nervous/anxious.    Physical Exam: Blood pressure 116/80, pulse 74, height 6' (1.829 m), weight 198 lb 3.2 oz (89.903 kg), SpO2 98 %. Body mass index is 26.87 kg/(m^2).  General - No distress ENT - No sinus tenderness, no oral exudate, no LAN, no thyromegaly, TM clear, pupils equal/reactive, MP 3 Cardiac -  s1s2 regular, no murmur, pulses symmetric Chest - No wheeze/rales/dullness, good air entry, normal respiratory excursion Back - No focal tenderness Abd - Soft, non-tender, no organomegaly, + bowel sounds Ext - No edema Neuro - Normal strength, cranial nerves intact Skin - No rashes Psych - Normal mood, and behavior  Discussion: In total he is getting about 6 to 8 hours of sleep per night.  His main issue is his sleep and wake time.  This is consistent with advanced sleep phase.  He has snoring, and there could be component of sleep apnea.  He had sleep study early this week > results pending.  Assessment/plan:  Advanced sleep phase. Plan: - advised him to stop taking melatonin - advised him to go to bed at 11pm and wake up at 6am > he is not to sleep outside this time period  Snoring with concern for sleep apnea. Plan: - will call him with results of sleep study   Chesley Mires, M.D. Pager [phone]

## 2014-11-20 NOTE — Patient Instructions (Signed)
Stop melatonin Try going to bed at 11 pm and waking up at 6 am Avoid sleeping outside this time frame Will call you with results of sleep study Follow up in 3 months

## 2014-11-21 NOTE — Sleep Study (Signed)
   NAME: Adrian Skinner DATE OF BIRTH:  1946-05-14 MEDICAL RECORD NUMBER [id number]  LOCATION: [facility]  PHYSICIAN: Afsa Meany D  DATE OF STUDY: 11/17/2014  SLEEP STUDY TYPE: Nocturnal Polysomnogram               REFERRING PHYSICIAN: Deland Pretty, MD  INDICATION FOR STUDY: Hypersomnia with sleep apnea  EPWORTH SLEEPINESS SCORE:   6/24 HEIGHT: 6' (182.9 cm)  WEIGHT: 195 lb (88.451 kg)    Body mass index is 26.44 kg/(m^2).  NECK SIZE: 15.5 in.  MEDICATIONS: Charted for review  SLEEP ARCHITECTURE: Total sleep time 257 minutes with sleep efficiency 70.8%. Stage I was 20.2%, stage II 70.4%, stage III absent, REM 9.3% of total sleep time. Sleep latency 9.5 minutes, REM latency 85 minutes, awake after sleep onset 59.5 minutes, arousal index 11.7, bedtime medication: Atorvastatin, pramipexole, omeprazole, irbesartan, melatonin  RESPIRATORY DATA: Apnea hypopnea index (AHI) 12.1 per hour. 52 total events scored, all as hypopneas and most while supine. REM AHI 27.5 per hour. There were not enough early events to meet protocol requirements for split CPAP titration.  OXYGEN DATA: Mild snoring with oxygen desaturation to a nadir of 88% and mean saturation 94% on room air  CARDIAC DATA: Sinus rhythm with PACs  MOVEMENT/PARASOMNIA: 76 total limb jerks scored, none appeared to affect sleep. Bathroom 2  IMPRESSION/ RECOMMENDATION:   1) Mild obstructive sleep apnea/hypopnea syndrome, AHI 12.1 per hour. Most events were hypopneas associated with supine sleep position and REM. REM AHI 27.5 per hour. Mild snoring with oxygen desaturation to a nadir of 88% and mean saturation 94% on room air. 2) He was fitted with a medium fullface mask for trial at the beginning of this study, but did not have sufficient early events to meet protocol requirements for split CPAP titration. 3) Had difficulty maintaining sleep after 2 AM with no sleep after 3:15 AM. He asked to leave at that time,  not feeling that he could regain sleep. Management for insomnia component might be helpful if appropriate.   Deneise Lever Diplomate, American Board of Sleep Medicine  ELECTRONICALLY SIGNED ON:  11/21/2014, 11:48 AM [place] PH: [phone]   FX: [phone] [place]

## 2014-11-25 ENCOUNTER — Encounter: Payer: Self-pay | Admitting: Pulmonary Disease

## 2014-11-25 ENCOUNTER — Telehealth: Payer: Self-pay | Admitting: Pulmonary Disease

## 2014-11-25 DIAGNOSIS — G4722 Circadian rhythm sleep disorder, advanced sleep phase type: Secondary | ICD-10-CM | POA: Insufficient documentation

## 2014-11-25 DIAGNOSIS — G4733 Obstructive sleep apnea (adult) (pediatric): Secondary | ICD-10-CM

## 2014-11-25 HISTORY — DX: Obstructive sleep apnea (adult) (pediatric): G47.33

## 2014-11-25 NOTE — Telephone Encounter (Signed)
PSG 11/17/14 >> AHI 12.1, SaO2 low 88%.  Will have my nurse inform pt that sleep study shows mild sleep apnea.  Recommend auto CPAP set up now.  If he is agreeable to this, then please send order for auto CPAP range 5 to 15 cm H2O with heated humidity and mask of choice.  Have download sent after one month on CPAP, and ensure he has ROV scheduled 2 months after CPAP set up.

## 2014-11-26 NOTE — Telephone Encounter (Signed)
LMTCB x 1 

## 2014-11-27 ENCOUNTER — Telehealth: Payer: Self-pay | Admitting: Pulmonary Disease

## 2014-11-27 NOTE — Telephone Encounter (Signed)
Spoke with pt- aware of rec's per VS Order placed for CPAP order.  Nothing further needed.

## 2014-11-27 NOTE — Telephone Encounter (Signed)
Spoke with patient and he is aware I am sending CPAP Order to [facility], he is agreeable to this

## 2014-12-03 ENCOUNTER — Telehealth: Payer: Self-pay | Admitting: Pulmonary Disease

## 2014-12-03 NOTE — Telephone Encounter (Signed)
Order placed 11/27/14. Staff message sent to [facility]. Pt aware working on this

## 2014-12-11 ENCOUNTER — Telehealth: Payer: Self-pay | Admitting: Pulmonary Disease

## 2014-12-11 NOTE — Telephone Encounter (Signed)
Spoke with pt- states that he has not heard from [place] was placed by our office 11/27/14 and patient still has not heard anything.  Advised that I would contact [place] to find out what is going on.  Called and spoke with [place] that the patient is wanting to know what is going on with the delay in getting a CPAP machine. states that she is going to call the patient right now to set this up.  Will hold in triage to follow up on

## 2014-12-14 NOTE — Telephone Encounter (Signed)
Spoke with Shirlean Mylar at [facility], states that pt was scheduled to have his cpap set up today but left a message for respiratory this morning to reschedule.  Nothing further needed at this time .

## 2014-12-31 ENCOUNTER — Encounter: Payer: Self-pay | Admitting: Pulmonary Disease

## 2015-01-15 ENCOUNTER — Telehealth: Payer: Self-pay | Admitting: Pulmonary Disease

## 2015-01-15 DIAGNOSIS — G4733 Obstructive sleep apnea (adult) (pediatric): Secondary | ICD-10-CM

## 2015-01-15 NOTE — Telephone Encounter (Signed)
Have him f/u with DME first.  His flonase in med list is listed as one spray each nostril daily >> I want him to use two sprays each nostril daily.  Please ensure this.

## 2015-01-15 NOTE — Telephone Encounter (Signed)
Called pt and made aware of recs. He already currently uses flonase daily along with netti pot qam. He has a mask hat covers nose and mouth currently. Dr. Halford Chessman do you want to send him over to sleep center for a mask fitting or through dme?

## 2015-01-15 NOTE — Telephone Encounter (Signed)
Please have him try flonase two sprays in each nostril nightly.  He should then resume using CPAP.  Please also find out what type of CPAP mask he is using >> this might need to be changed.    He should call back if he is still having trouble.

## 2015-01-15 NOTE — Telephone Encounter (Signed)
Called spoke with pt. He reports he has been having a "sinus HA" since being on CPAP. He was taking ibuprofen all day long. He stopped wearing his CPAP x 4-5 days now and his sinus HA has went away. He reports VS told him his sleep apnea was very mild sleep apnea. He wants to know what the next step would be? Please advise Dr. Halford Chessman thanks

## 2015-01-15 NOTE — Telephone Encounter (Signed)
Called made pt aware of below. Order placed for DME. Nothing further needed

## 2015-01-25 ENCOUNTER — Telehealth: Payer: Self-pay | Admitting: Pulmonary Disease

## 2015-01-25 DIAGNOSIS — G4733 Obstructive sleep apnea (adult) (pediatric): Secondary | ICD-10-CM

## 2015-01-25 NOTE — Telephone Encounter (Signed)
Spoke with the pt  He states that he is having trouble with FFM "blowing air up my nose"- causing sinus HA  He is going for mask fitting this wk and was told that they would try a nasal mask  He is concerned b/c he states "it's still going to blow air up my nose"  He wonders if VS has any other recs  Please advise, thanks

## 2015-01-25 NOTE — Telephone Encounter (Signed)
Please get his CPAP download.  He might need to have adjustment to pressure setting.

## 2015-01-25 NOTE — Telephone Encounter (Signed)
lmtcb x1 for pt. 

## 2015-01-26 NOTE — Telephone Encounter (Signed)
[phone] calling back

## 2015-01-26 NOTE — Telephone Encounter (Signed)
Per download report from 6/29-7/29 from [facility]: Pt wore CPAP 19/31 days Min Pressure 5 cmH2O Max Pressure 15 cmH2O EPR Fulltime EPR level 3 Pressure - cmH2O Median: 6.7 95th percentile: 9.1 Maximum: 10.3 Leaks - L/min Median: 0.0 95th percentile: 2.8 Maximum: 11.8 Events per hour AI: 1.6 HI: 0.2 AHI: 1.8 Apnea Index Central: 0.7 Obstructive: 0.9 Unknown: 0.0 RERA Index 0.1  Pt is scheduled to go see [facility] tomorrow AM at 11:30. Please advise Dr. Halford Chessman thanks

## 2015-01-27 NOTE — Telephone Encounter (Signed)
Please have [facility] change his CPAP to 8 cm H2O.  He should call back if he feels this is still too much pressure, and can then have pressure lowered more.

## 2015-01-27 NOTE — Telephone Encounter (Signed)
Spoke with Fritz Pickerel at [facility]. He asked to fax a written order to them at [phone]. Order placed to be sent. Orders faxed to [facility]. Nothing further needed.

## 2015-01-28 IMAGING — CR DG CHEST 2V
2 series · 2 of 2 positions shown · non-contrast
Comparison: None.

CLINICAL DATA: Cardiac palpitations

EXAM:
CHEST  2 VIEW

[w chest pa]
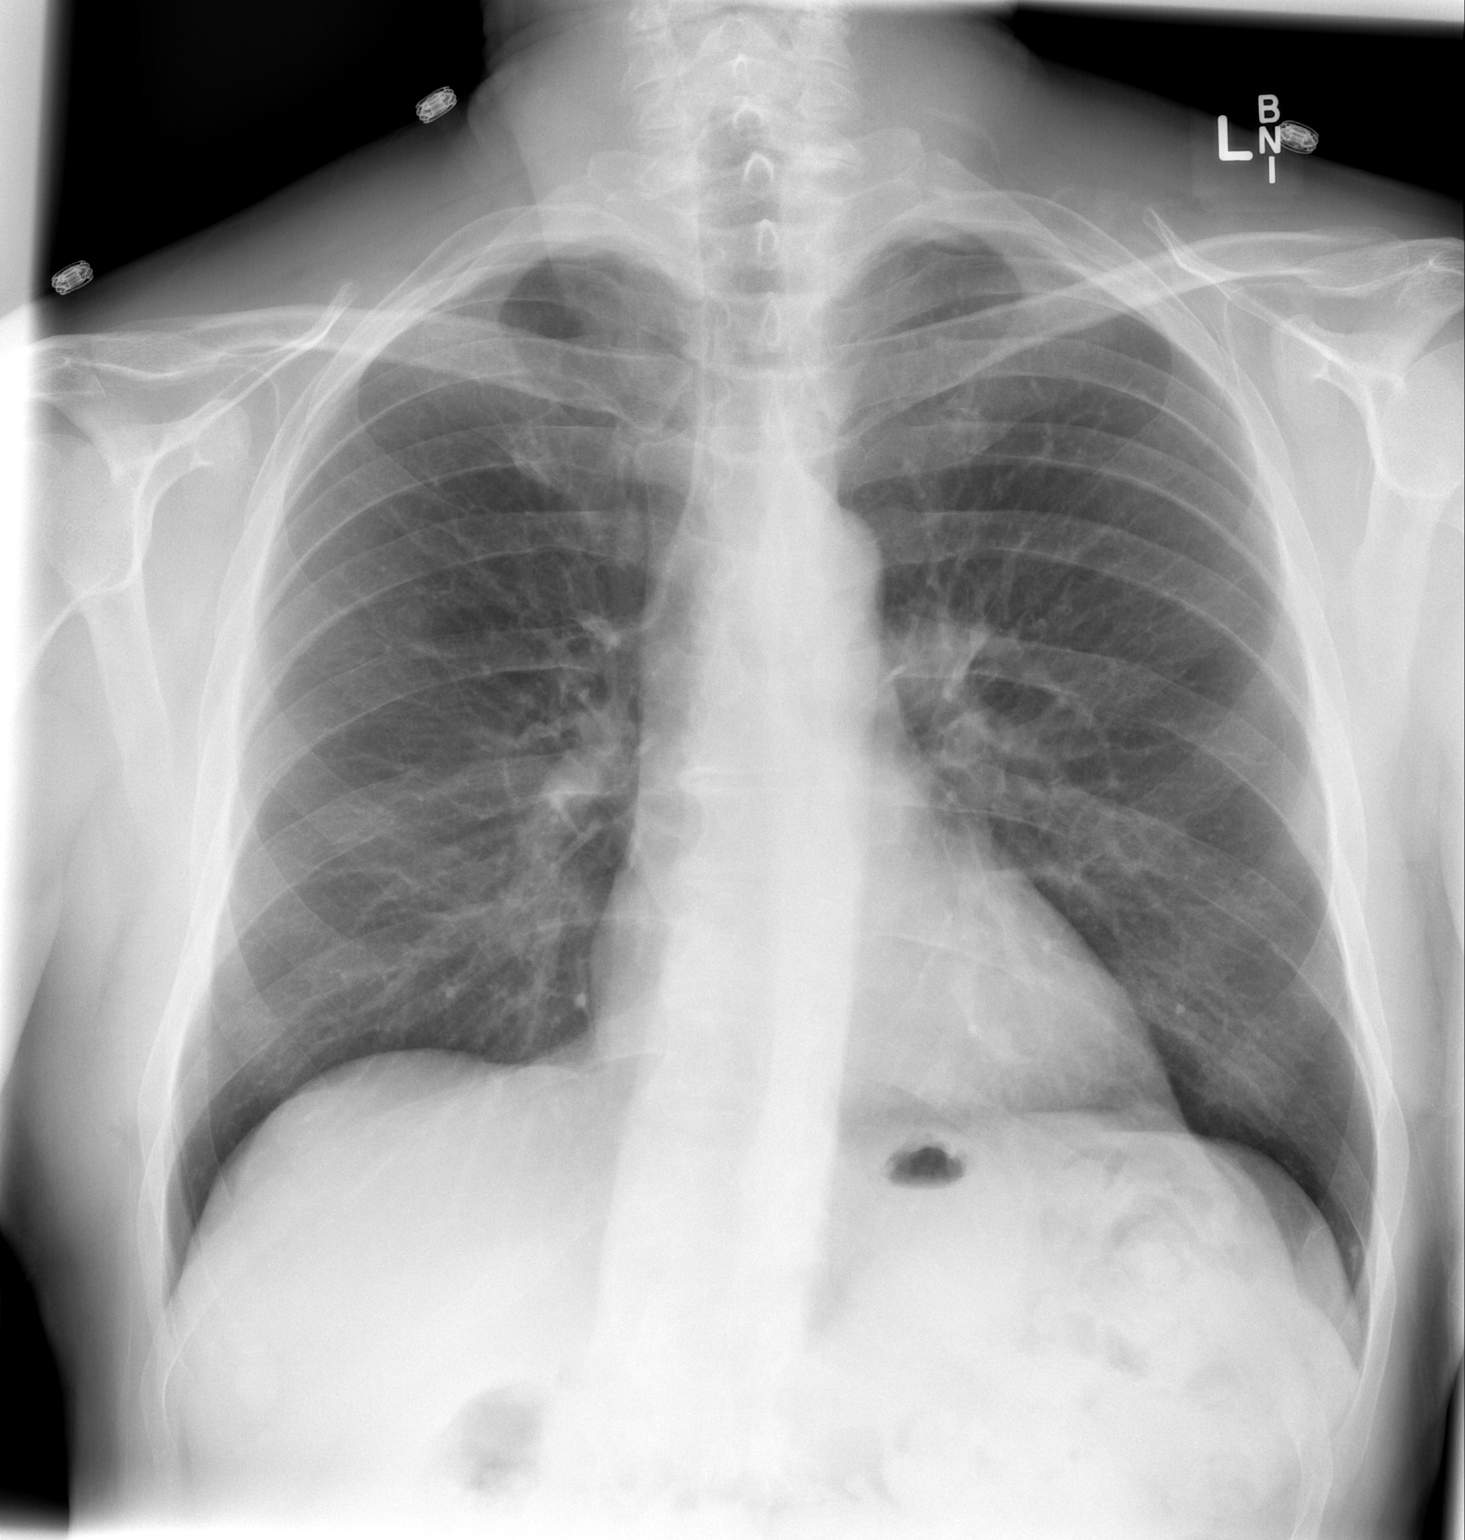

[w chest lat]
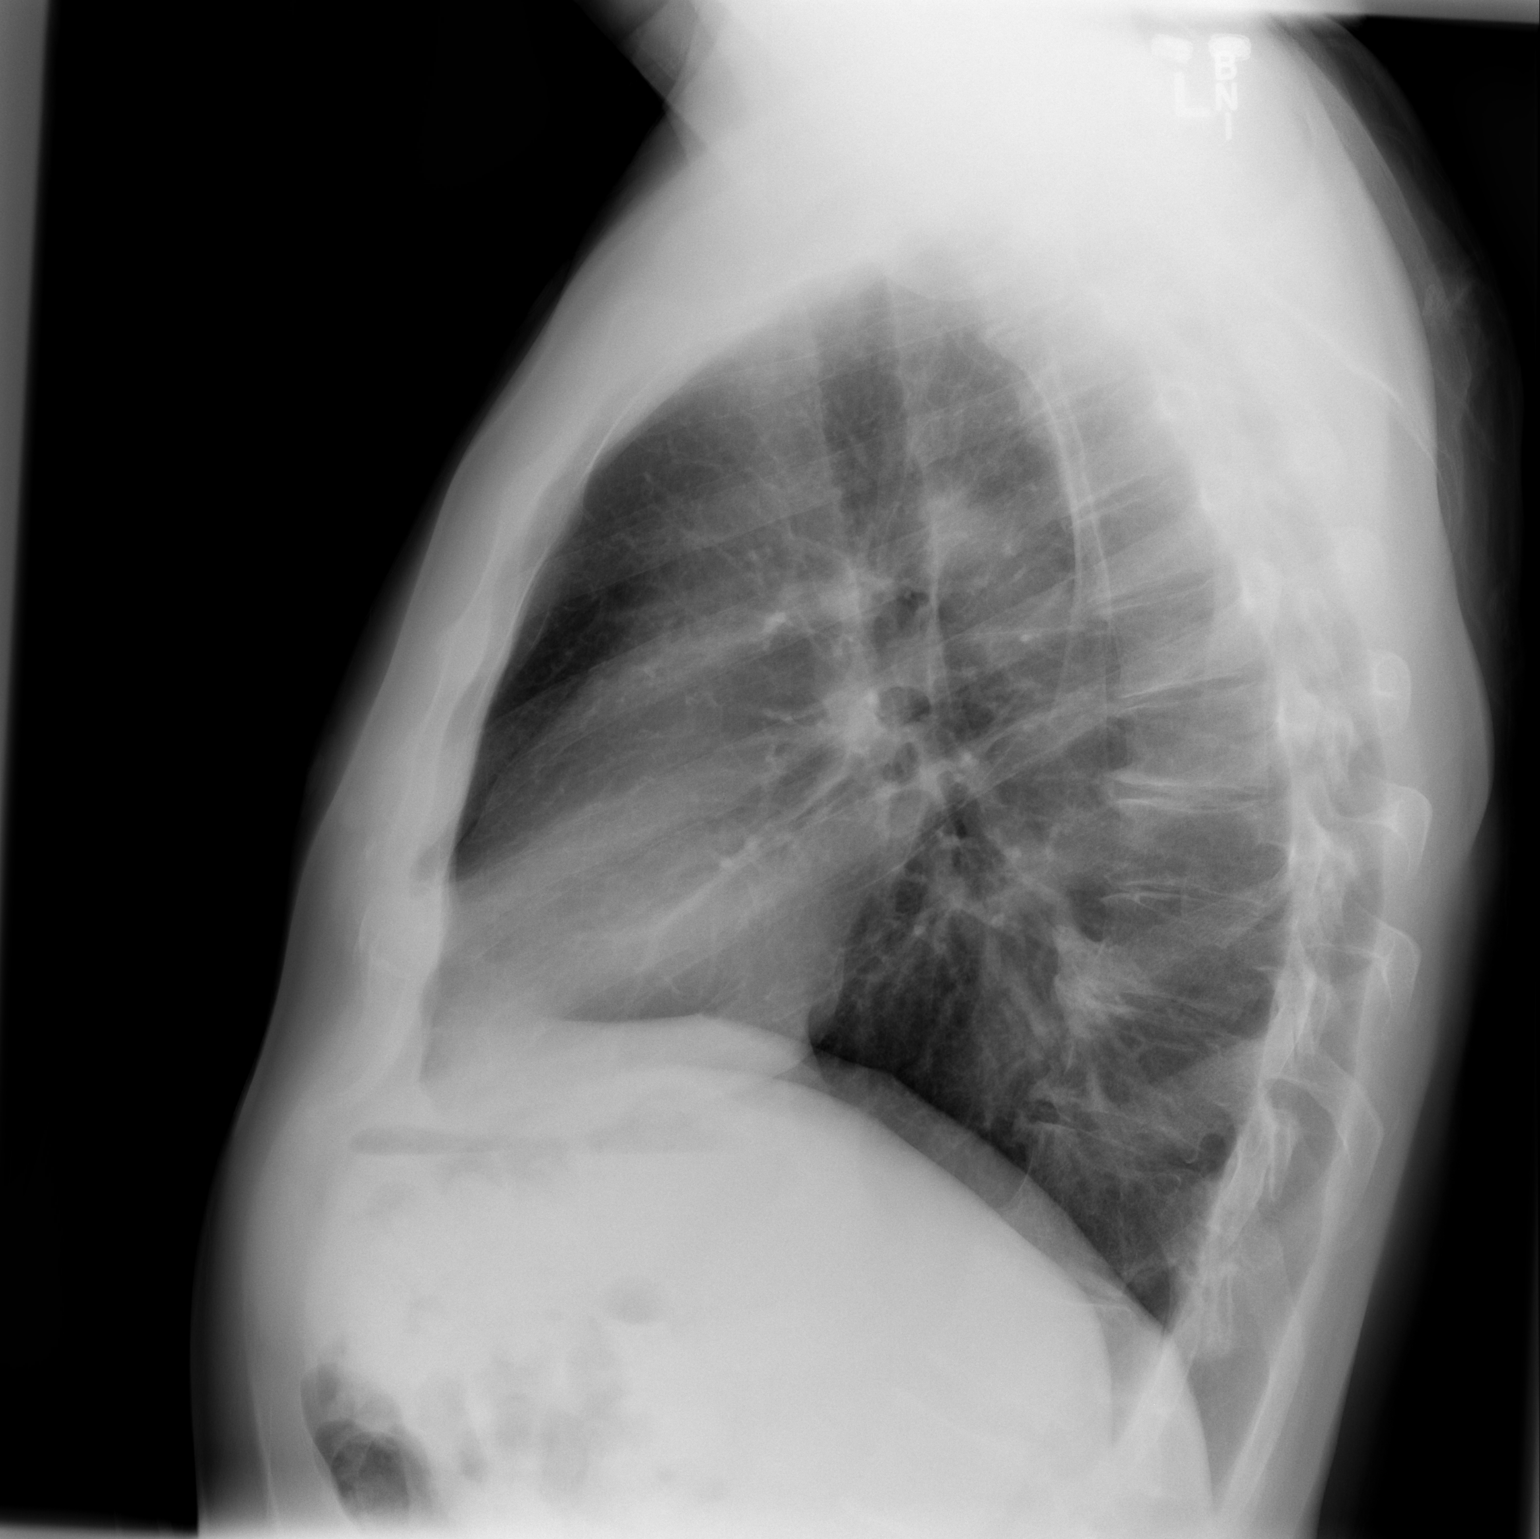

[2 of 2 positions shown; findings below may reference images not displayed]

FINDINGS: Lungs are clear. Heart size and pulmonary vascularity are normal. No
adenopathy. There is mild degenerative change in the thoracic spine.
IMPRESSION: No edema or consolidation.

## 2015-01-29 ENCOUNTER — Telehealth: Payer: Self-pay | Admitting: Pulmonary Disease

## 2015-01-29 NOTE — Telephone Encounter (Signed)
Spoke with pt. He needs an appointment to be seen before 03/02/15 for CPAP compliance. Pt has been scheduled with TP on 02/17/15 at 9am. Nothing further was needed.

## 2015-02-17 ENCOUNTER — Ambulatory Visit: Payer: PPO | Admitting: Adult Health

## 2015-02-26 ENCOUNTER — Encounter: Payer: Self-pay | Admitting: Adult Health

## 2015-02-26 ENCOUNTER — Ambulatory Visit (INDEPENDENT_AMBULATORY_CARE_PROVIDER_SITE_OTHER): Payer: PPO | Admitting: Adult Health

## 2015-02-26 VITALS — BP 112/86 | HR 74 | Temp 97.7°F | Ht 72.0 in | Wt 201.0 lb

## 2015-02-26 DIAGNOSIS — G4733 Obstructive sleep apnea (adult) (pediatric): Secondary | ICD-10-CM

## 2015-02-26 NOTE — Progress Notes (Signed)
   Subjective:    Patient ID: Adrian Skinner, male    DOB: 1946-04-28, 69 y.o.   MRN: [id number]  HPI 69 yo male with OSA  TEST  PSG 11/17/14 >> AHI 12.1, SaO2 low 88%.   02/26/2015 Follow up OSA  Patient returns for a follow-up. Patient was recently seen for a pulmonary consult in May for sleep issues. Sleep study showed that he had mild sleep apnea with AHI around 12. Patient was started on CPAP  Download today shows good compliance with average. Usage at 4.5 hours, minimum leaks, AHI 1.7. Patient says that he really does not notice much improvement in his sleep pattern or daytime energy. We discussed sleep apnea and potential complications of untreated sleep apnea. We also talked about healthy sleep habits, such as no TV at bedtime.  Main issue is that he falls asleep watching TV gets up within an hour or 2 goes to bed and then only wears his C Pap for 4 hours because he wakes up every night around 3-4 am.  Patient denies any chest pain, orthopnea, PND or leg swelling.   Review of Systems Constitutional:   No  weight loss, night sweats,  Fevers, chills, fatigue, or  lassitude.  HEENT:   No headaches,  Difficulty swallowing,  Tooth/dental problems, or  Sore throat,                No sneezing, itching, ear ache, nasal congestion, post nasal drip,   CV:  No chest pain,  Orthopnea, PND, swelling in lower extremities, anasarca, dizziness, palpitations, syncope.   GI  No heartburn, indigestion, abdominal pain, nausea, vomiting, diarrhea, change in bowel habits, loss of appetite, bloody stools.   Resp: No shortness of breath with exertion or at rest.  No excess mucus, no productive cough,  No non-productive cough,  No coughing up of blood.  No change in color of mucus.  No wheezing.  No chest wall deformity  Skin: no rash or lesions.  GU: no dysuria, change in color of urine, no urgency or frequency.  No flank pain, no hematuria   MS:  No joint pain or swelling.  No decreased range of  motion.  No back pain.  Psych:  No change in mood or affect. No depression or anxiety.  No memory loss.          Objective:   Physical Exam  GEN: A/Ox3; pleasant , NAD, well nourished   HEENT:  [place]/AT,  EACs-clear, TMs-wnl, NOSE-clear, THROAT-clear, no lesions, no postnasal drip or exudate noted. Class 2-3 airway   NECK:  Supple w/ fair ROM; no JVD; normal carotid impulses w/o bruits; no thyromegaly or nodules palpated; no lymphadenopathy.  RESP  Clear  P & A; w/o, wheezes/ rales/ or rhonchi.no accessory muscle use, no dullness to percussion  CARD:  RRR, no m/r/g  , no peripheral edema, pulses intact, no cyanosis or clubbing.  GI:   Soft & nt; nml bowel sounds; no organomegaly or masses detected.  Musco: Warm bil, no deformities or joint swelling noted.   Neuro: alert, no focal deficits noted.    Skin: Warm, no lesions or rashes        Assessment & Plan:

## 2015-02-26 NOTE — Assessment & Plan Note (Signed)
Mild sleep apnea Patient has good compliance on his C Pap. Encouraged on healthy sleep regimen  Plan  Keep up good work .  Work on healthy sleep regimen we discussed  Try to wear CPAP at least 6hrs each night  Do not drive if sleepy Follow up Dr. Halford Chessman  In 6 month and As needed

## 2015-02-26 NOTE — Patient Instructions (Signed)
Keep up good work .  Work on healthy sleep regimen we discussed  Try to wear CPAP at least 6hrs each night  Do not drive if sleepy Follow up Dr. Halford Chessman  In 6 month and As needed

## 2015-02-27 NOTE — Progress Notes (Signed)
Reviewed and agree with assessment/plan. 

## 2015-04-27 ENCOUNTER — Encounter: Payer: Self-pay | Admitting: Adult Health

## 2015-06-29 DIAGNOSIS — G4733 Obstructive sleep apnea (adult) (pediatric): Secondary | ICD-10-CM | POA: Diagnosis not present

## 2015-06-29 DIAGNOSIS — G479 Sleep disorder, unspecified: Secondary | ICD-10-CM | POA: Diagnosis not present

## 2015-06-29 DIAGNOSIS — G2581 Restless legs syndrome: Secondary | ICD-10-CM | POA: Diagnosis not present

## 2015-06-30 DIAGNOSIS — L821 Other seborrheic keratosis: Secondary | ICD-10-CM | POA: Diagnosis not present

## 2015-06-30 DIAGNOSIS — D485 Neoplasm of uncertain behavior of skin: Secondary | ICD-10-CM | POA: Diagnosis not present

## 2015-06-30 DIAGNOSIS — D171 Benign lipomatous neoplasm of skin and subcutaneous tissue of trunk: Secondary | ICD-10-CM | POA: Diagnosis not present

## 2015-06-30 DIAGNOSIS — D225 Melanocytic nevi of trunk: Secondary | ICD-10-CM | POA: Diagnosis not present

## 2015-06-30 DIAGNOSIS — L814 Other melanin hyperpigmentation: Secondary | ICD-10-CM | POA: Diagnosis not present

## 2015-06-30 DIAGNOSIS — L570 Actinic keratosis: Secondary | ICD-10-CM | POA: Diagnosis not present

## 2015-07-02 DIAGNOSIS — G2581 Restless legs syndrome: Secondary | ICD-10-CM | POA: Diagnosis not present

## 2015-07-12 DIAGNOSIS — R51 Headache: Secondary | ICD-10-CM | POA: Diagnosis not present

## 2015-07-12 DIAGNOSIS — R400 Somnolence: Secondary | ICD-10-CM | POA: Diagnosis not present

## 2015-07-12 DIAGNOSIS — K625 Hemorrhage of anus and rectum: Secondary | ICD-10-CM | POA: Diagnosis not present

## 2015-07-12 DIAGNOSIS — I10 Essential (primary) hypertension: Secondary | ICD-10-CM | POA: Diagnosis not present

## 2015-07-16 DIAGNOSIS — G4733 Obstructive sleep apnea (adult) (pediatric): Secondary | ICD-10-CM | POA: Diagnosis not present

## 2015-07-21 DIAGNOSIS — D485 Neoplasm of uncertain behavior of skin: Secondary | ICD-10-CM | POA: Diagnosis not present

## 2015-07-21 DIAGNOSIS — D235 Other benign neoplasm of skin of trunk: Secondary | ICD-10-CM | POA: Diagnosis not present

## 2015-07-25 DIAGNOSIS — G4733 Obstructive sleep apnea (adult) (pediatric): Secondary | ICD-10-CM | POA: Diagnosis not present

## 2015-08-11 DIAGNOSIS — H2513 Age-related nuclear cataract, bilateral: Secondary | ICD-10-CM | POA: Diagnosis not present

## 2015-08-11 DIAGNOSIS — H5213 Myopia, bilateral: Secondary | ICD-10-CM | POA: Diagnosis not present

## 2015-08-24 DIAGNOSIS — G4733 Obstructive sleep apnea (adult) (pediatric): Secondary | ICD-10-CM | POA: Diagnosis not present

## 2015-08-25 ENCOUNTER — Ambulatory Visit: Payer: PPO | Admitting: Pulmonary Disease

## 2015-09-02 DIAGNOSIS — Z1211 Encounter for screening for malignant neoplasm of colon: Secondary | ICD-10-CM | POA: Diagnosis not present

## 2015-09-02 DIAGNOSIS — K6389 Other specified diseases of intestine: Secondary | ICD-10-CM | POA: Diagnosis not present

## 2015-09-02 DIAGNOSIS — K640 First degree hemorrhoids: Secondary | ICD-10-CM | POA: Diagnosis not present

## 2015-09-02 DIAGNOSIS — D126 Benign neoplasm of colon, unspecified: Secondary | ICD-10-CM | POA: Diagnosis not present

## 2015-09-02 DIAGNOSIS — D122 Benign neoplasm of ascending colon: Secondary | ICD-10-CM | POA: Diagnosis not present

## 2015-09-22 DIAGNOSIS — G4733 Obstructive sleep apnea (adult) (pediatric): Secondary | ICD-10-CM | POA: Diagnosis not present

## 2015-09-23 DIAGNOSIS — J0100 Acute maxillary sinusitis, unspecified: Secondary | ICD-10-CM | POA: Diagnosis not present

## 2015-09-23 DIAGNOSIS — I10 Essential (primary) hypertension: Secondary | ICD-10-CM | POA: Diagnosis not present

## 2015-09-23 DIAGNOSIS — R400 Somnolence: Secondary | ICD-10-CM | POA: Diagnosis not present

## 2015-09-23 DIAGNOSIS — Z13220 Encounter for screening for lipoid disorders: Secondary | ICD-10-CM | POA: Diagnosis not present

## 2015-09-23 DIAGNOSIS — R600 Localized edema: Secondary | ICD-10-CM | POA: Diagnosis not present

## 2015-09-30 DIAGNOSIS — H10412 Chronic giant papillary conjunctivitis, left eye: Secondary | ICD-10-CM | POA: Diagnosis not present

## 2015-10-06 DIAGNOSIS — G2581 Restless legs syndrome: Secondary | ICD-10-CM | POA: Diagnosis not present

## 2015-10-06 DIAGNOSIS — G4733 Obstructive sleep apnea (adult) (pediatric): Secondary | ICD-10-CM | POA: Diagnosis not present

## 2015-10-06 DIAGNOSIS — G4722 Circadian rhythm sleep disorder, advanced sleep phase type: Secondary | ICD-10-CM | POA: Diagnosis not present

## 2015-10-14 DIAGNOSIS — G4733 Obstructive sleep apnea (adult) (pediatric): Secondary | ICD-10-CM | POA: Diagnosis not present

## 2015-10-23 DIAGNOSIS — G4733 Obstructive sleep apnea (adult) (pediatric): Secondary | ICD-10-CM | POA: Diagnosis not present

## 2015-11-02 DIAGNOSIS — G4733 Obstructive sleep apnea (adult) (pediatric): Secondary | ICD-10-CM | POA: Diagnosis not present

## 2015-11-15 DIAGNOSIS — R42 Dizziness and giddiness: Secondary | ICD-10-CM | POA: Diagnosis not present

## 2015-11-18 DIAGNOSIS — I10 Essential (primary) hypertension: Secondary | ICD-10-CM | POA: Diagnosis not present

## 2015-11-18 DIAGNOSIS — R42 Dizziness and giddiness: Secondary | ICD-10-CM | POA: Diagnosis not present

## 2015-11-24 ENCOUNTER — Encounter: Payer: Self-pay | Admitting: *Deleted

## 2015-11-24 DIAGNOSIS — H8143 Vertigo of central origin, bilateral: Secondary | ICD-10-CM | POA: Diagnosis not present

## 2015-11-24 DIAGNOSIS — I951 Orthostatic hypotension: Secondary | ICD-10-CM | POA: Diagnosis not present

## 2015-11-26 ENCOUNTER — Telehealth: Payer: Self-pay | Admitting: *Deleted

## 2015-11-26 ENCOUNTER — Ambulatory Visit: Payer: PPO | Admitting: Diagnostic Neuroimaging

## 2015-11-26 NOTE — Telephone Encounter (Signed)
Spoke with patient re: Dr Leta Baptist has to leave office unexpectedly today. With patient's verbalized understanding, rescheduled his appointment to earliest available, 12/03/15, 10:30 am.  He verbalized understanding.

## 2015-12-03 ENCOUNTER — Encounter: Payer: Self-pay | Admitting: Diagnostic Neuroimaging

## 2015-12-03 ENCOUNTER — Ambulatory Visit (INDEPENDENT_AMBULATORY_CARE_PROVIDER_SITE_OTHER): Payer: PPO | Admitting: Diagnostic Neuroimaging

## 2015-12-03 VITALS — BP 129/77 | HR 76 | Ht 72.0 in | Wt 202.2 lb

## 2015-12-03 DIAGNOSIS — R42 Dizziness and giddiness: Secondary | ICD-10-CM

## 2015-12-03 DIAGNOSIS — I951 Orthostatic hypotension: Secondary | ICD-10-CM

## 2015-12-03 DIAGNOSIS — R269 Unspecified abnormalities of gait and mobility: Secondary | ICD-10-CM | POA: Diagnosis not present

## 2015-12-03 MED ORDER — PREDNISONE 10 MG PO TABS: ORAL_TABLET | ORAL | Status: DC
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
## 2015-12-13 ENCOUNTER — Telehealth: Payer: Self-pay | Admitting: Diagnostic Neuroimaging

## 2015-12-13 DIAGNOSIS — M795 Residual foreign body in soft tissue: Secondary | ICD-10-CM
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
## 2015-12-13 NOTE — Telephone Encounter (Signed)
Pt called back to see about getting a scan on his left eye and send it to plastic surgeon. Pt wants a new scan so they have a most recent one on hand before surgery. He would like to speak with the nurse

## 2015-12-13 NOTE — Telephone Encounter (Signed)
Pt called said he will need referral to plastic surgeon to have piece of metal removed in his head so he can have MRI scan that Dr Leta Baptist. Please call

## 2015-12-16 NOTE — Telephone Encounter (Signed)
Pt called said plastic surgeon wants him to bring scan of left frontal lobe. He is wanting to know if Dr Leta Baptist wants to set him up for that or if he would prefer the surgeon schedule it. Please call him at [phone]

## 2015-12-16 NOTE — Telephone Encounter (Signed)
Spoke with patient and informed him that Dr Leta Baptist set up plastic surgeon consult and will have plastic surgeon decide appropriate pre-op imaging. Patient verbalized understanding, appreciation for call back.

## 2015-12-30 DIAGNOSIS — L570 Actinic keratosis: Secondary | ICD-10-CM | POA: Diagnosis not present

## 2015-12-30 DIAGNOSIS — I10 Essential (primary) hypertension: Secondary | ICD-10-CM | POA: Diagnosis not present

## 2015-12-30 DIAGNOSIS — L738 Other specified follicular disorders: Secondary | ICD-10-CM | POA: Diagnosis not present

## 2016-01-10 DIAGNOSIS — M795 Residual foreign body in soft tissue: Secondary | ICD-10-CM | POA: Diagnosis not present

## 2016-01-11 ENCOUNTER — Other Ambulatory Visit: Payer: Self-pay | Admitting: Plastic Surgery

## 2016-01-11 ENCOUNTER — Ambulatory Visit
Admission: RE | Admit: 2016-01-11 | Discharge: 2016-01-11 | Disposition: A | Discharge status: Home / Self Care | Payer: PPO | Source: Ambulatory Visit | Attending: Plastic Surgery | Admitting: Plastic Surgery

## 2016-01-11 DIAGNOSIS — S0085XA Superficial foreign body of other part of head, initial encounter: Secondary | ICD-10-CM | POA: Diagnosis not present

## 2016-01-11 DIAGNOSIS — S0095XA Superficial foreign body of unspecified part of head, initial encounter: Secondary | ICD-10-CM

## 2016-01-14 ENCOUNTER — Ambulatory Visit: Payer: PPO | Admitting: Diagnostic Neuroimaging

## 2016-01-27 DIAGNOSIS — M26622 Arthralgia of left temporomandibular joint: Secondary | ICD-10-CM | POA: Diagnosis not present

## 2016-01-27 DIAGNOSIS — J0191 Acute recurrent sinusitis, unspecified: Secondary | ICD-10-CM | POA: Diagnosis not present

## 2016-02-14 DIAGNOSIS — K219 Gastro-esophageal reflux disease without esophagitis: Secondary | ICD-10-CM | POA: Diagnosis not present

## 2016-02-14 DIAGNOSIS — R51 Headache: Secondary | ICD-10-CM | POA: Diagnosis not present

## 2016-02-14 DIAGNOSIS — J329 Chronic sinusitis, unspecified: Secondary | ICD-10-CM | POA: Diagnosis not present

## 2016-02-18 ENCOUNTER — Ambulatory Visit: Payer: PPO | Admitting: Cardiology

## 2016-02-21 ENCOUNTER — Ambulatory Visit: Payer: PPO | Admitting: Diagnostic Neuroimaging

## 2016-02-22 DIAGNOSIS — J329 Chronic sinusitis, unspecified: Secondary | ICD-10-CM | POA: Diagnosis not present

## 2016-03-09 DIAGNOSIS — G4733 Obstructive sleep apnea (adult) (pediatric): Secondary | ICD-10-CM | POA: Diagnosis not present

## 2016-03-21 DIAGNOSIS — J018 Other acute sinusitis: Secondary | ICD-10-CM | POA: Diagnosis not present

## 2016-03-21 DIAGNOSIS — H6981 Other specified disorders of Eustachian tube, right ear: Secondary | ICD-10-CM | POA: Diagnosis not present

## 2016-03-21 DIAGNOSIS — H6502 Acute serous otitis media, left ear: Secondary | ICD-10-CM | POA: Diagnosis not present

## 2016-03-27 ENCOUNTER — Ambulatory Visit: Payer: PPO | Admitting: Diagnostic Neuroimaging

## 2016-03-28 DIAGNOSIS — J329 Chronic sinusitis, unspecified: Secondary | ICD-10-CM | POA: Diagnosis not present

## 2016-03-28 DIAGNOSIS — R51 Headache: Secondary | ICD-10-CM | POA: Diagnosis not present

## 2016-03-30 DIAGNOSIS — G4733 Obstructive sleep apnea (adult) (pediatric): Secondary | ICD-10-CM | POA: Diagnosis not present

## 2016-04-05 DIAGNOSIS — R51 Headache: Secondary | ICD-10-CM | POA: Diagnosis not present

## 2016-04-05 DIAGNOSIS — H6502 Acute serous otitis media, left ear: Secondary | ICD-10-CM | POA: Diagnosis not present

## 2016-04-12 ENCOUNTER — Telehealth: Payer: Self-pay | Admitting: Diagnostic Neuroimaging

## 2016-04-12 NOTE — Telephone Encounter (Signed)
Patient is calling to discuss possibly getting an MRI for headaches.

## 2016-04-12 NOTE — Telephone Encounter (Signed)
Returned patient's call and advised that he saw Dr Leta Baptist in June for gait difficulty, orthostatic hypotension and dizziness. He stated he has had headaches for many years, but that he and Dr Leta Baptist "got side tracked on his other issues".  Patient then stated he is being treated by an ENT for migraines and taking Topamax 2 tablets at night. He stated he is not getting relief, "I wake up with a headache every day."Hhe inquired if Dr Stark Klein would see him for headaches, or should he go to a headache clinic. This RN advised that would be his choice, but Dr Leta Baptist would have to see him to evaluate him before considering an MRI. He then inquired if he should stay with the ENT and "go through his regime first". This RN advised that is his best option since he is already being treated by ENT. Advised that he update ENT with his headache status to discuss further options. Patient verbalized understanding, appreciation.

## 2016-04-17 ENCOUNTER — Telehealth: Payer: Self-pay | Admitting: Cardiology

## 2016-04-17 ENCOUNTER — Ambulatory Visit (INDEPENDENT_AMBULATORY_CARE_PROVIDER_SITE_OTHER): Payer: PPO | Admitting: Physician Assistant

## 2016-04-17 ENCOUNTER — Encounter: Payer: Self-pay | Admitting: Physician Assistant

## 2016-04-17 VITALS — BP 110/68 | HR 67 | Ht 72.0 in | Wt 203.2 lb

## 2016-04-17 DIAGNOSIS — R0602 Shortness of breath: Secondary | ICD-10-CM

## 2016-04-17 DIAGNOSIS — E785 Hyperlipidemia, unspecified: Secondary | ICD-10-CM | POA: Diagnosis not present

## 2016-04-17 DIAGNOSIS — I10 Essential (primary) hypertension: Secondary | ICD-10-CM | POA: Diagnosis not present

## 2016-04-17 DIAGNOSIS — R079 Chest pain, unspecified: Secondary | ICD-10-CM | POA: Diagnosis not present

## 2016-04-17 LAB — CBC WITH DIFFERENTIAL/PLATELET
BASOS ABS: 0 {cells}/uL (ref 0–200)
Basophils Relative: 0 %
EOS PCT: 3 %
Eosinophils Absolute: 168 cells/uL (ref 15–500)
HEMATOCRIT: 36.9 % — AB (ref 38.5–50.0)
HEMOGLOBIN: 12.6 g/dL — AB (ref 13.2–17.1)
LYMPHS ABS: 1792 {cells}/uL (ref 850–3900)
Lymphocytes Relative: 32 %
MCH: 32.4 pg (ref 27.0–33.0)
MCHC: 34.1 g/dL (ref 32.0–36.0)
MCV: 94.9 fL (ref 80.0–100.0)
MONO ABS: 448 {cells}/uL (ref 200–950)
MPV: 9.6 fL (ref 7.5–12.5)
Monocytes Relative: 8 %
NEUTROS ABS: 3192 {cells}/uL (ref 1500–7800)
NEUTROS PCT: 57 %
Platelets: 238 10*3/uL (ref 140–400)
RBC: 3.89 MIL/uL — AB (ref 4.20–5.80)
RDW: 13.6 % (ref 11.0–15.0)
WBC: 5.6 10*3/uL (ref 3.8–10.8)

## 2016-04-17 LAB — TROPONIN I: Troponin I: <0.01 ng/mL (ref <=0.05)

## 2016-04-17 LAB — TSH: TSH: 2.73 mIU/L (ref 0.40–4.50)

## 2016-04-17 NOTE — Telephone Encounter (Signed)
Returned call. Confirmed appt made by Dalene Seltzer this AM to see Adrian Skinner at 2pm.  patient reports "little energy" for 6 months, "no energy at all" in last month. tired when bending over to tie shoes.  noted chest pressure last night.  I advised patient to go to ED if sudden worsening of shortness of breath or if return of active chest pressure/pain.  Pt voiced understanding. Aware of appt information. Will see [name] in office this afternoon if no new concerns.

## 2016-04-17 NOTE — Telephone Encounter (Signed)
Mr.Ellenberger is calling because last night he had some chest pressure and he is experiencing  some shortness of breath and is wanting to get in to see someone . Please call

## 2016-04-17 NOTE — Telephone Encounter (Signed)
Called patient to inform him that his troponin level was negative. He verbalized understanding, will continue plan for Echo and Cardiopulmnary stress testing.

## 2016-04-17 NOTE — Patient Instructions (Signed)
Medication Instructions:   Continue current medications.   Labwork: Your physician recommends that you return for lab work in: [place].   Testing/Procedures: Your physician has requested that you have an echocardiogram. Echocardiography is a painless test that uses sound waves to create images of your heart. It provides your doctor with information about the size and shape of your heart and how well your heart's chambers and valves are working. This procedure takes approximately one hour. There are no restrictions for this procedure.   Your physician has recommended that you have a cardiopulmonary stress test (CPX). CPX testing is a non-invasive measurement of heart and lung function. It replaces a traditional treadmill stress test. This type of test provides a tremendous amount of information that relates not only to your present condition but also for future outcomes. This test combines measurements of you ventilation, respiratory gas exchange in the lungs, electrocardiogram (EKG), blood pressure and physical response before, during, and following an exercise protocol.    Follow-Up: Your physician recommends that you schedule a follow-up appointment in: [place].   If you need a refill on your cardiac medications before your next appointment, please call your pharmacy.

## 2016-04-17 NOTE — Progress Notes (Signed)
Cardiology Office Note    Date:  04/17/2016   ID:  Adrian Skinner, DOB 06/23/1946, MRN [id number]  PCP:  Adrian Pel, MD  Cardiologist:  Adrian Skinner  Chief Complaint  Patient presents with  . Follow-up    seen for Adrian Skinner, evaluation for chest discomfort.    History of Present Illness:  Adrian Skinner is a 70 y.o. male with PMH of mild OSA, hypertension and hyperlipidemia who was previously evaluated by Adrian Skinner on 11/06/2013 with chest pain. His symptom was concerning for obstructive coronary artery disease. Stress test obtained on 11/07/2013 was intermediate risk with relatively small area of mild inferoseptal ischemia. He underwent cardiac catheterization on 11/11/2013 which showed EF 55-65%, 10-20% mid LAD lesion, no other significant coronary artery disease. It does not since he has been seen by cardiology office since then.  Patient presents today for cardiology office evaluation, for the past 3-6 month, he has been having increasing amount of weakness. He says he just does not have the energy anymore. For the past 3 month, he has been noticing intermittent shortness of breath, he is not sure if it's related to exertion. This has been gradually getting worse until yesterday when he started having tightness in the chest. He says he was trimming the hedges outside and did not have any exertional chest pain but did have some shortness of breath. Once he returned back into the room, he started noticing the tightness in the chest. It lasted several hours before away and this morning recurred again. He denies any history of blood clot and says his shortness of breath only occurs episodically. He does [place] once a week. He denies any recent fever, chill or cough. He is still waiting for his CPAP to arrive on 11/6.    Past Medical History:  Diagnosis Date  . Cataract   . Dizziness   . Environmental and seasonal allergies   . Essential hypertension   .  Hyperlipidemia   . OSA (obstructive sleep apnea) 11/25/2014  . Reflux   . Vertigo     Past Surgical History:  Procedure Laterality Date  . LEFT HEART CATHETERIZATION WITH CORONARY ANGIOGRAM N/A 11/11/2013   Procedure: LEFT HEART CATHETERIZATION WITH CORONARY ANGIOGRAM;  Surgeon: Peter M Martinique, MD;  Location: [facility] LAB;  Service: Cardiovascular;  Laterality: N/A;  . ROTATOR CUFF REPAIR     right  . TONSILLECTOMY AND ADENOIDECTOMY      Current Medications: Outpatient Medications Prior to Visit  Medication Sig Dispense Refill  . aspirin EC 81 MG tablet Take 81 mg by mouth daily.    Marland Kitchen atorvastatin (LIPITOR) 20 MG tablet Take 20 mg by mouth daily.    . irbesartan-hydrochlorothiazide (AVALIDE) 150-12.5 MG per tablet Take 1 tablet by mouth daily.    . pramipexole (MIRAPEX) 0.5 MG tablet Take 1 mg by mouth daily.     . ranitidine (ZANTAC) 150 MG tablet daily.    . sertraline (ZOLOFT) 50 MG tablet Take 25 mg by mouth daily.     . Fluticasone Propionate (ALLERGY SPRAY 24 HOUR NA) Place into the nose.    . predniSONE (DELTASONE) 10 MG tablet Take 60mg  on day 1. Reduce by 10mg  each subsequent day. (60, 50, 40, 30, 20, 10, stop) 21 tablet 0  . UNABLE TO FIND daily. Kristopher Oppenheim brand allergy nasal spray.     No facility-administered medications prior to visit.      Allergies:   Review of  patient's allergies indicates no known allergies.   Social History   Social History  . Marital status: Married    Spouse name: N/A  . Number of children: 2  . Years of education: Bachelors   Occupational History  . Retired    Social History Main Topics  . Smoking status: Never Smoker  . Smokeless tobacco: None  . Alcohol use 0.0 oz/week     Comment: glass of wine (2-4oz) daily  . Drug use: No  . Sexual activity: Not Asked   Other Topics Concern  . None   Social History Narrative   Retired.  Part time driver for [employer].  Lives at home with wife.       PT IS ADOPTED- NO MEDICAL HISTORY FOR  FAMILY.      Right-handed.      2 cups caffeine per day.     Family History:  The patient's He was adopted. Family history is unknown by patient.   ROS:   Please see the history of present illness.    ROS All other systems reviewed and are negative.   PHYSICAL EXAM:   VS:  BP 110/68   Pulse 67   Ht 6' (1.829 m)   Wt 203 lb 3.2 oz (92.2 kg)   BMI 27.56 kg/m    GEN: Well nourished, well developed, in no acute distress  HEENT: normal  Neck: no JVD, carotid bruits, or masses Cardiac: RRR; no murmurs, rubs, or gallops,no edema  Respiratory:  clear to auscultation bilaterally, normal work of breathing GI: soft, nontender, nondistended, + BS MS: no deformity or atrophy  Skin: warm and dry, no rash Neuro:  Alert and Oriented x 3, Strength and sensation are intact Psych: euthymic mood, full affect  Wt Readings from Last 3 Encounters:  04/17/16 203 lb 3.2 oz (92.2 kg)  12/03/15 202 lb 3.2 oz (91.7 kg)  02/26/15 201 lb (91.2 kg)      Studies/Labs Reviewed:   EKG:  EKG is ordered today.  The ekg ordered today demonstrates Normal sinus rhythm without significant ST-T wave changes.  Recent Labs: No results found for requested labs within last 8760 hours.   Lipid Panel No results found for: CHOL, TRIG, HDL, CHOLHDL, VLDL, LDLCALC, LDLDIRECT  Additional studies/ records that were reviewed today include:   Myoview 11/07/2013 Impression Exercise Capacity:  Good exercise capacity. BP Response:  Normal blood pressure response. Clinical Symptoms:  No significant symptoms noted. ECG Impression:  Significant ST abnormalities consistent with ischemia. Comparison with Prior Nuclear Study: No previous nuclear study performed   Overall Impression:   Intermediate risk stress nuclear study with a relatively small area of mild inferoseptal ischemia.    Cath 11/12/2015 Procedural Findings: Hemodynamics: AO 115/59 mean 85 mm Hg LV 116/11 mm Hg  Coronary angiography: Coronary  dominance: right  Left mainstem: Normal   Left anterior descending (LAD): 10-20% mid vessel.  Left circumflex (LCx): Normal  Right coronary artery (RCA): Normal  Left ventriculography: Left ventricular systolic function is normal, LVEF is estimated at 55-65%, there is no significant mitral regurgitation   Final Conclusions:   1. No significant coronary artery disease. 2. Normal LV function.  Recommendations: Consider alternative causes of chest pain.   ASSESSMENT:    1. Chest pain, unspecified type   2. SOB (shortness of breath)   3. Essential hypertension   4. Hyperlipidemia, unspecified hyperlipidemia type      PLAN:  In order of problems listed above:  1. Chest pain  -  Since yesterday, previous Myoview in May 2015 was falsely positive, cardiac catheterization performed on 11/11/2013 only revealed 10-20% LAD disease, otherwise clean coronary. His chance of developing significant coronary artery disease over the last 2 and half years is relatively small. I think his chest discomfort is relatively low risk, however I am more concerned about his shortness of breath.  - I will obtain a troponin. EKG obtained today did not reveal any significant ST-T wave changes.  2. SOB and fatigue  - He has worsening fatigue and shortness of breath for the past 3 month.  - Unclear cause, however will obtain baseline labs including TSH, CBC and basic metabolic panel.  - Will also obtain echocardiogram and a cardiopulmonary stress test.   - I also think, once he gets his CPAP machine, some of these symptom may improve.  3. HTN: He says he is not on irbesartan/hydrochlorothiazide combination, he is on irbesartan alone.  4. HLD: Currently on 20 mg daily of Lipitor.   Medication Adjustments/Labs and Tests Ordered: Current medicines are reviewed at length with the patient today.  Concerns regarding medicines are outlined above.  Medication changes, Labs and Tests ordered today are  listed in the Patient Instructions below. Patient Instructions  Medication Instructions:   Continue current medications.   Labwork: Your physician recommends that you return for lab work in: [place].   Testing/Procedures: Your physician has requested that you have an echocardiogram. Echocardiography is a painless test that uses sound waves to create images of your heart. It provides your doctor with information about the size and shape of your heart and how well your heart's chambers and valves are working. This procedure takes approximately one hour. There are no restrictions for this procedure.   Your physician has recommended that you have a cardiopulmonary stress test (CPX). CPX testing is a non-invasive measurement of heart and lung function. It replaces a traditional treadmill stress test. This type of test provides a tremendous amount of information that relates not only to your present condition but also for future outcomes. This test combines measurements of you ventilation, respiratory gas exchange in the lungs, electrocardiogram (EKG), blood pressure and physical response before, during, and following an exercise protocol.    Follow-Up: Your physician recommends that you schedule a follow-up appointment in: [place].   If you need a refill on your cardiac medications before your next appointment, please call your pharmacy.      Hilbert Corrigan, [place]  04/17/2016 3:04 PM    [facility] [address], [place], [place]  [address] Phone: [phone]; Fax: [phone]

## 2016-04-18 LAB — BASIC METABOLIC PANEL
BUN: 23 mg/dL (ref 7–25)
CO2: 24 mmol/L (ref 20–31)
Calcium: 9 mg/dL (ref 8.6–10.3)
Chloride: 107 mmol/L (ref 98–110)
Creat: 1.33 mg/dL — ABNORMAL HIGH (ref 0.70–1.18)
GLUCOSE: 91 mg/dL (ref 65–99)
POTASSIUM: 4.2 mmol/L (ref 3.5–5.3)
SODIUM: 140 mmol/L (ref 135–146)

## 2016-04-19 ENCOUNTER — Ambulatory Visit: Payer: PPO | Admitting: Physician Assistant

## 2016-04-19 ENCOUNTER — Telehealth: Payer: Self-pay

## 2016-04-19 NOTE — Telephone Encounter (Signed)
Returned call to patient [employer] advice given.Stated he does not know if symptoms are worse since starting Topamax.Stated he is in the process of getting a new cpap machine.Advised to keep appointments as planned.

## 2016-04-19 NOTE — Telephone Encounter (Signed)
Yes, topamax can potentially cause fatigue and somnolence. The question really would be when he started on Topamax, whether his symptom noticeably worsened afterward. Also topamax does not explain his chest discomfort.

## 2016-04-19 NOTE — Telephone Encounter (Signed)
Patient called he wanted to let Almyra Deforest PA know he forgot to mention he takes Topamax 50 mg daily.Stated wanted to know if [name] thinks this may be causing him to be lethargic.Message to sent Almyra Deforest PA for advice.

## 2016-04-26 DIAGNOSIS — Q72811 Congenital shortening of right lower limb: Secondary | ICD-10-CM | POA: Diagnosis not present

## 2016-04-26 DIAGNOSIS — M9902 Segmental and somatic dysfunction of thoracic region: Secondary | ICD-10-CM | POA: Diagnosis not present

## 2016-04-26 DIAGNOSIS — M9903 Segmental and somatic dysfunction of lumbar region: Secondary | ICD-10-CM | POA: Diagnosis not present

## 2016-04-26 DIAGNOSIS — M9905 Segmental and somatic dysfunction of pelvic region: Secondary | ICD-10-CM | POA: Diagnosis not present

## 2016-04-26 DIAGNOSIS — M9904 Segmental and somatic dysfunction of sacral region: Secondary | ICD-10-CM | POA: Diagnosis not present

## 2016-04-26 DIAGNOSIS — M5136 Other intervertebral disc degeneration, lumbar region: Secondary | ICD-10-CM | POA: Diagnosis not present

## 2016-04-26 DIAGNOSIS — M5417 Radiculopathy, lumbosacral region: Secondary | ICD-10-CM | POA: Diagnosis not present

## 2016-05-01 DIAGNOSIS — M9902 Segmental and somatic dysfunction of thoracic region: Secondary | ICD-10-CM | POA: Diagnosis not present

## 2016-05-01 DIAGNOSIS — M5136 Other intervertebral disc degeneration, lumbar region: Secondary | ICD-10-CM | POA: Diagnosis not present

## 2016-05-01 DIAGNOSIS — M5417 Radiculopathy, lumbosacral region: Secondary | ICD-10-CM | POA: Diagnosis not present

## 2016-05-01 DIAGNOSIS — Q72811 Congenital shortening of right lower limb: Secondary | ICD-10-CM | POA: Diagnosis not present

## 2016-05-01 DIAGNOSIS — M9905 Segmental and somatic dysfunction of pelvic region: Secondary | ICD-10-CM | POA: Diagnosis not present

## 2016-05-01 DIAGNOSIS — M9904 Segmental and somatic dysfunction of sacral region: Secondary | ICD-10-CM | POA: Diagnosis not present

## 2016-05-01 DIAGNOSIS — M9903 Segmental and somatic dysfunction of lumbar region: Secondary | ICD-10-CM | POA: Diagnosis not present

## 2016-05-03 ENCOUNTER — Ambulatory Visit (HOSPITAL_COMMUNITY): Payer: PPO

## 2016-05-03 DIAGNOSIS — G4733 Obstructive sleep apnea (adult) (pediatric): Secondary | ICD-10-CM | POA: Diagnosis not present

## 2016-05-05 ENCOUNTER — Other Ambulatory Visit: Payer: Self-pay

## 2016-05-05 ENCOUNTER — Ambulatory Visit (HOSPITAL_COMMUNITY): Payer: PPO | Attending: Physician Assistant

## 2016-05-05 DIAGNOSIS — R079 Chest pain, unspecified: Secondary | ICD-10-CM | POA: Insufficient documentation

## 2016-05-05 DIAGNOSIS — R0602 Shortness of breath: Secondary | ICD-10-CM | POA: Diagnosis not present

## 2016-05-08 DIAGNOSIS — M5136 Other intervertebral disc degeneration, lumbar region: Secondary | ICD-10-CM | POA: Diagnosis not present

## 2016-05-08 DIAGNOSIS — Q72811 Congenital shortening of right lower limb: Secondary | ICD-10-CM | POA: Diagnosis not present

## 2016-05-08 DIAGNOSIS — M9903 Segmental and somatic dysfunction of lumbar region: Secondary | ICD-10-CM | POA: Diagnosis not present

## 2016-05-08 DIAGNOSIS — M9902 Segmental and somatic dysfunction of thoracic region: Secondary | ICD-10-CM | POA: Diagnosis not present

## 2016-05-08 DIAGNOSIS — M5417 Radiculopathy, lumbosacral region: Secondary | ICD-10-CM | POA: Diagnosis not present

## 2016-05-08 DIAGNOSIS — M9904 Segmental and somatic dysfunction of sacral region: Secondary | ICD-10-CM | POA: Diagnosis not present

## 2016-05-08 DIAGNOSIS — M9905 Segmental and somatic dysfunction of pelvic region: Secondary | ICD-10-CM | POA: Diagnosis not present

## 2016-05-10 DIAGNOSIS — M5136 Other intervertebral disc degeneration, lumbar region: Secondary | ICD-10-CM | POA: Diagnosis not present

## 2016-05-10 DIAGNOSIS — M9905 Segmental and somatic dysfunction of pelvic region: Secondary | ICD-10-CM | POA: Diagnosis not present

## 2016-05-10 DIAGNOSIS — M9904 Segmental and somatic dysfunction of sacral region: Secondary | ICD-10-CM | POA: Diagnosis not present

## 2016-05-10 DIAGNOSIS — M5417 Radiculopathy, lumbosacral region: Secondary | ICD-10-CM | POA: Diagnosis not present

## 2016-05-10 DIAGNOSIS — Q72811 Congenital shortening of right lower limb: Secondary | ICD-10-CM | POA: Diagnosis not present

## 2016-05-10 DIAGNOSIS — M9903 Segmental and somatic dysfunction of lumbar region: Secondary | ICD-10-CM | POA: Diagnosis not present

## 2016-05-10 DIAGNOSIS — M9902 Segmental and somatic dysfunction of thoracic region: Secondary | ICD-10-CM | POA: Diagnosis not present

## 2016-05-11 DIAGNOSIS — Z125 Encounter for screening for malignant neoplasm of prostate: Secondary | ICD-10-CM | POA: Diagnosis not present

## 2016-05-11 DIAGNOSIS — I10 Essential (primary) hypertension: Secondary | ICD-10-CM | POA: Diagnosis not present

## 2016-05-11 DIAGNOSIS — E7800 Pure hypercholesterolemia, unspecified: Secondary | ICD-10-CM | POA: Diagnosis not present

## 2016-05-15 DIAGNOSIS — M9904 Segmental and somatic dysfunction of sacral region: Secondary | ICD-10-CM | POA: Diagnosis not present

## 2016-05-15 DIAGNOSIS — M9905 Segmental and somatic dysfunction of pelvic region: Secondary | ICD-10-CM | POA: Diagnosis not present

## 2016-05-15 DIAGNOSIS — M9902 Segmental and somatic dysfunction of thoracic region: Secondary | ICD-10-CM | POA: Diagnosis not present

## 2016-05-15 DIAGNOSIS — M9903 Segmental and somatic dysfunction of lumbar region: Secondary | ICD-10-CM | POA: Diagnosis not present

## 2016-05-15 DIAGNOSIS — Q72811 Congenital shortening of right lower limb: Secondary | ICD-10-CM | POA: Diagnosis not present

## 2016-05-15 DIAGNOSIS — M5136 Other intervertebral disc degeneration, lumbar region: Secondary | ICD-10-CM | POA: Diagnosis not present

## 2016-05-15 DIAGNOSIS — M5417 Radiculopathy, lumbosacral region: Secondary | ICD-10-CM | POA: Diagnosis not present

## 2016-05-17 DIAGNOSIS — Z0001 Encounter for general adult medical examination with abnormal findings: Secondary | ICD-10-CM | POA: Diagnosis not present

## 2016-05-17 DIAGNOSIS — G4720 Circadian rhythm sleep disorder, unspecified type: Secondary | ICD-10-CM | POA: Diagnosis not present

## 2016-05-17 DIAGNOSIS — Z1212 Encounter for screening for malignant neoplasm of rectum: Secondary | ICD-10-CM | POA: Diagnosis not present

## 2016-05-17 DIAGNOSIS — J309 Allergic rhinitis, unspecified: Secondary | ICD-10-CM | POA: Diagnosis not present

## 2016-05-17 DIAGNOSIS — N529 Male erectile dysfunction, unspecified: Secondary | ICD-10-CM | POA: Diagnosis not present

## 2016-05-24 NOTE — Progress Notes (Signed)
Cardiology Office Note   Date:  05/28/2016   ID:  TYREESE Skinner, DOB 08-30-1945, MRN [id number]  PCP:  Horatio Pel, MD  Cardiologist:   Minus Breeding, MD   Chief Complaint  Patient presents with  . Follow-up  . Shortness of Breath      History of Present Illness: Adrian Skinner is a 70 y.o. male who presents for follow up of chest pain.  I saw the patient on 11/06/2013 with chest pain. His symptom was concerning for obstructive coronary artery disease. Stress test obtained on 11/07/2013 was intermediate risk with relatively small area of mild inferoseptal ischemia. He underwent cardiac catheterization on 11/11/2013 which showed EF 55-65%, 10-20% mid LAD lesion, no other significant coronary artery disease.  He presented recently and was seen by Almyra Deforest PA.  He had SOB.  An echo was normal.   Cardiopulmonary stress testing was ordered.  However, patient reports he has back problems and limits his physical activity. The also has a difficult sleep cycle which she thinks might contribute. He really describes shortness of breath only if he does something significantly exerting such as climbing multiple stairs. He can walk 10,000 steps at work without being short of breath. Describing any PND or orthopnea. He has no chest pressure, neck or arm discomfort and has had no weight gain or edema.    Past Medical History:  Diagnosis Date  . Cataract   . Dizziness   . Environmental and seasonal allergies   . Essential hypertension   . Hyperlipidemia   . OSA (obstructive sleep apnea) 11/25/2014  . Reflux   . Vertigo     Past Surgical History:  Procedure Laterality Date  . LEFT HEART CATHETERIZATION WITH CORONARY ANGIOGRAM N/A 11/11/2013   Procedure: LEFT HEART CATHETERIZATION WITH CORONARY ANGIOGRAM;  Surgeon: Peter M Martinique, MD;  Location: [facility] CATH LAB;  Service: Cardiovascular;  Laterality: N/A;  . ROTATOR CUFF REPAIR     right  . TONSILLECTOMY AND ADENOIDECTOMY       Current  Outpatient Prescriptions  Medication Sig Dispense Refill  . aspirin EC 81 MG tablet Take 81 mg by mouth daily.    Marland Kitchen atorvastatin (LIPITOR) 20 MG tablet Take 20 mg by mouth daily.    . fluticasone (FLONASE) 50 MCG/ACT nasal spray Place 1 spray into both nostrils daily.    . irbesartan (AVAPRO) 150 MG tablet Take 150 mg by mouth daily.    . irbesartan-hydrochlorothiazide (AVALIDE) 150-12.5 MG per tablet Take 1 tablet by mouth daily.    Marland Kitchen omeprazole (PRILOSEC) 40 MG capsule Take 40 mg by mouth 2 (two) times daily.    . pramipexole (MIRAPEX) 0.5 MG tablet Take 1 mg by mouth daily.     . sertraline (ZOLOFT) 25 MG tablet Take 25 mg by mouth daily.    Marland Kitchen topiramate (TOPAMAX) 50 MG tablet Take 1 tablet (50 mg total) by mouth daily. 30 tablet 0   No current facility-administered medications for this visit.     Allergies:   Patient has no known allergies.    ROS:  Please see the history of present illness.   Otherwise, review of systems are positive for none.   All other systems are reviewed and negative.    PHYSICAL EXAM: VS:  BP 122/72   Pulse 68   Ht 6' (1.829 m)   Wt 207 lb (93.9 kg)   BMI 28.07 kg/m  , BMI Body mass index is 28.07 kg/m. GENERAL:  Well appearing  HEENT:  Pupils equal round and reactive, fundi not visualized, oral mucosa unremarkable NECK:  No jugular venous distention, waveform within normal limits, carotid upstroke brisk and symmetric, no bruits, no thyromegaly LYMPHATICS:  No cervical, inguinal adenopathy LUNGS:  Clear to auscultation bilaterally BACK:  No CVA tenderness CHEST:  Unremarkable HEART:  PMI not displaced or sustained,S1 and S2 within normal limits, no S3, no S4, no clicks, no rubs, no murmurs ABD:  Flat, positive bowel sounds normal in frequency in pitch, no bruits, no rebound, no guarding, no midline pulsatile mass, no hepatomegaly, no splenomegaly EXT:  2 plus pulses throughout, trace edema, no cyanosis no clubbing SKIN:  No rashes no nodules NEURO:   Cranial nerves II through XII grossly intact, motor grossly intact throughout PSYCH:  Cognitively intact, oriented to person place and time    EKG:  EKG is not ordered today.    Recent Labs: 04/17/2016: BUN 23; Creat 1.33; Hemoglobin 12.6; Platelets 238; Potassium 4.2; Sodium 140; TSH 2.73    Lipid Panel No results found for: CHOL, TRIG, HDL, CHOLHDL, VLDL, LDLCALC, LDLDIRECT    Wt Readings from Last 3 Encounters:  05/25/16 207 lb (93.9 kg)  04/17/16 203 lb 3.2 oz (92.2 kg)  12/03/15 202 lb 3.2 oz (91.7 kg)      Other studies Reviewed: Additional studies/ records that were reviewed today include: echo. Review of the above records demonstrates:  Please see elsewhere in the note.     ASSESSMENT AND PLAN:  DYSPNEA:  I think this is multifactorial and may be related to deconditioning. At this point we both agree that we will hold off on cardiopulmonary stress testing. Rather he will start an exercise regimen and I have suggested swimming. If in 6 months is worse rather than better than we would need further testing.  HTN:  The blood pressure is at target. No change in medications is indicated. We will continue with therapeutic lifestyle changes (TLC).   Current medicines are reviewed at length with the patient today.  The patient does not have concerns regarding medicines.  The following changes have been made:  no change  Labs/ tests ordered today include: None No orders of the defined types were placed in this encounter.    Disposition:   FU with me in one year.     Signed, Minus Breeding, MD  05/28/2016 10:34 AM    [facility]

## 2016-05-25 ENCOUNTER — Ambulatory Visit (INDEPENDENT_AMBULATORY_CARE_PROVIDER_SITE_OTHER): Payer: PPO | Admitting: Cardiology

## 2016-05-25 ENCOUNTER — Encounter: Payer: Self-pay | Admitting: Cardiology

## 2016-05-25 VITALS — BP 122/72 | HR 68 | Ht 72.0 in | Wt 207.0 lb

## 2016-05-25 DIAGNOSIS — R0602 Shortness of breath: Secondary | ICD-10-CM | POA: Diagnosis not present

## 2016-05-25 NOTE — Patient Instructions (Signed)

## 2016-05-26 ENCOUNTER — Encounter (HOSPITAL_COMMUNITY): Payer: Self-pay

## 2016-05-26 ENCOUNTER — Encounter (HOSPITAL_COMMUNITY): Payer: PPO

## 2016-05-27 DIAGNOSIS — S61211A Laceration without foreign body of left index finger without damage to nail, initial encounter: Secondary | ICD-10-CM | POA: Diagnosis not present

## 2016-06-02 DIAGNOSIS — G4733 Obstructive sleep apnea (adult) (pediatric): Secondary | ICD-10-CM | POA: Diagnosis not present

## 2016-06-15 DIAGNOSIS — M5136 Other intervertebral disc degeneration, lumbar region: Secondary | ICD-10-CM | POA: Diagnosis not present

## 2016-06-22 ENCOUNTER — Telehealth: Payer: Self-pay | Admitting: Cardiology

## 2016-06-22 NOTE — Telephone Encounter (Signed)
From notes 05/25/16:  DYSPNEA:  I think this is multifactorial and may be related to deconditioning. At this point we both agree that we will hold off on cardiopulmonary stress testing. Rather he will start an exercise regimen and I have suggested swimming. If in 6 months is worse rather than better than we would need further testing. ---------------------------------------   Spoke to patient. re: dyspnea Notes this is not new and has been addressed recently. His symptoms aren't worse, no new symptoms to report. 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 wonders if this is "because I'm carrying extra weight" starting [employer] and exercise program in new year.  Notes has not started swimming yet due to a bandaged finger injury (cut tip of finger off in early December, and just got bandage off 2 days ago)  He's calling mainly because he got spooked with comments by wife regarding his shortness of breath when putting up the Christmas decorations. Voiced that he feels like testing insofar has been conclusive and he doesn't want to "sound the alarm" but notes that since his wife was was concerned, he was calling for reassurance. Notes his confidence in previous discussions w Dr. Percival Spanish regarding his dyspnea. He also notes continued concerns regarding sleep hygiene. States because he is up early/sleeps little, he's eating twice in AM, probably eating more than he should. I did suggest starting exercise program as noted, encouraged him w/ Weight Watchers. He voiced understanding. States understanding. OK w this advice but noted if Dr. Percival Spanish has anything to add, to let him know. O/w patient will plan to follow up as recommended.

## 2016-06-22 NOTE — Telephone Encounter (Signed)
New Message  Pt voiced he has a question about a test with which is cancelled and pt feels as though it was a mistake.  Please f/u with pt

## 2016-06-23 NOTE — Telephone Encounter (Signed)
Left Adrian Skinner a message communicating provider advice and to call if questions or new concerns.

## 2016-06-23 NOTE — Telephone Encounter (Signed)
Follow up as recommended

## 2016-06-28 DIAGNOSIS — L821 Other seborrheic keratosis: Secondary | ICD-10-CM | POA: Diagnosis not present

## 2016-06-28 DIAGNOSIS — L570 Actinic keratosis: Secondary | ICD-10-CM | POA: Diagnosis not present

## 2016-06-28 DIAGNOSIS — L603 Nail dystrophy: Secondary | ICD-10-CM | POA: Diagnosis not present

## 2016-07-03 DIAGNOSIS — G4733 Obstructive sleep apnea (adult) (pediatric): Secondary | ICD-10-CM | POA: Diagnosis not present

## 2016-07-05 DIAGNOSIS — G4722 Circadian rhythm sleep disorder, advanced sleep phase type: Secondary | ICD-10-CM | POA: Diagnosis not present

## 2016-07-05 DIAGNOSIS — G4733 Obstructive sleep apnea (adult) (pediatric): Secondary | ICD-10-CM | POA: Diagnosis not present

## 2016-07-13 DIAGNOSIS — R51 Headache: Secondary | ICD-10-CM | POA: Diagnosis not present

## 2016-07-13 DIAGNOSIS — K219 Gastro-esophageal reflux disease without esophagitis: Secondary | ICD-10-CM | POA: Diagnosis not present

## 2016-07-13 DIAGNOSIS — H6502 Acute serous otitis media, left ear: Secondary | ICD-10-CM | POA: Diagnosis not present

## 2016-07-24 DIAGNOSIS — I959 Hypotension, unspecified: Secondary | ICD-10-CM | POA: Diagnosis not present

## 2016-07-24 DIAGNOSIS — R0982 Postnasal drip: Secondary | ICD-10-CM | POA: Diagnosis not present

## 2016-07-24 DIAGNOSIS — R5383 Other fatigue: Secondary | ICD-10-CM | POA: Diagnosis not present

## 2016-07-24 DIAGNOSIS — R05 Cough: Secondary | ICD-10-CM | POA: Diagnosis not present

## 2016-07-28 DIAGNOSIS — Z8679 Personal history of other diseases of the circulatory system: Secondary | ICD-10-CM | POA: Diagnosis not present

## 2016-07-28 DIAGNOSIS — F411 Generalized anxiety disorder: Secondary | ICD-10-CM | POA: Diagnosis not present

## 2016-08-03 DIAGNOSIS — G4733 Obstructive sleep apnea (adult) (pediatric): Secondary | ICD-10-CM | POA: Diagnosis not present

## 2016-08-23 DIAGNOSIS — H2513 Age-related nuclear cataract, bilateral: Secondary | ICD-10-CM | POA: Diagnosis not present

## 2016-08-23 DIAGNOSIS — R2681 Unsteadiness on feet: Secondary | ICD-10-CM | POA: Diagnosis not present

## 2016-08-23 DIAGNOSIS — H5213 Myopia, bilateral: Secondary | ICD-10-CM | POA: Diagnosis not present

## 2016-08-31 DIAGNOSIS — G4733 Obstructive sleep apnea (adult) (pediatric): Secondary | ICD-10-CM | POA: Diagnosis not present

## 2016-09-21 DIAGNOSIS — H2511 Age-related nuclear cataract, right eye: Secondary | ICD-10-CM | POA: Diagnosis not present

## 2016-09-21 DIAGNOSIS — H25811 Combined forms of age-related cataract, right eye: Secondary | ICD-10-CM | POA: Diagnosis not present

## 2016-09-28 DIAGNOSIS — H25812 Combined forms of age-related cataract, left eye: Secondary | ICD-10-CM | POA: Diagnosis not present

## 2016-09-28 DIAGNOSIS — H2512 Age-related nuclear cataract, left eye: Secondary | ICD-10-CM | POA: Diagnosis not present

## 2016-10-01 DIAGNOSIS — G4733 Obstructive sleep apnea (adult) (pediatric): Secondary | ICD-10-CM | POA: Diagnosis not present

## 2016-10-04 DIAGNOSIS — Q72811 Congenital shortening of right lower limb: Secondary | ICD-10-CM | POA: Diagnosis not present

## 2016-10-04 DIAGNOSIS — M9903 Segmental and somatic dysfunction of lumbar region: Secondary | ICD-10-CM | POA: Diagnosis not present

## 2016-10-04 DIAGNOSIS — M9902 Segmental and somatic dysfunction of thoracic region: Secondary | ICD-10-CM | POA: Diagnosis not present

## 2016-10-04 DIAGNOSIS — M9904 Segmental and somatic dysfunction of sacral region: Secondary | ICD-10-CM | POA: Diagnosis not present

## 2016-10-04 DIAGNOSIS — M5136 Other intervertebral disc degeneration, lumbar region: Secondary | ICD-10-CM | POA: Diagnosis not present

## 2016-10-04 DIAGNOSIS — M5417 Radiculopathy, lumbosacral region: Secondary | ICD-10-CM | POA: Diagnosis not present

## 2016-10-04 DIAGNOSIS — M9905 Segmental and somatic dysfunction of pelvic region: Secondary | ICD-10-CM | POA: Diagnosis not present

## 2016-10-09 DIAGNOSIS — Q72811 Congenital shortening of right lower limb: Secondary | ICD-10-CM | POA: Diagnosis not present

## 2016-10-09 DIAGNOSIS — M9903 Segmental and somatic dysfunction of lumbar region: Secondary | ICD-10-CM | POA: Diagnosis not present

## 2016-10-09 DIAGNOSIS — M5136 Other intervertebral disc degeneration, lumbar region: Secondary | ICD-10-CM | POA: Diagnosis not present

## 2016-10-09 DIAGNOSIS — M5417 Radiculopathy, lumbosacral region: Secondary | ICD-10-CM | POA: Diagnosis not present

## 2016-10-09 DIAGNOSIS — M9902 Segmental and somatic dysfunction of thoracic region: Secondary | ICD-10-CM | POA: Diagnosis not present

## 2016-10-09 DIAGNOSIS — E7800 Pure hypercholesterolemia, unspecified: Secondary | ICD-10-CM | POA: Diagnosis not present

## 2016-10-09 DIAGNOSIS — G4700 Insomnia, unspecified: Secondary | ICD-10-CM | POA: Diagnosis not present

## 2016-10-09 DIAGNOSIS — M9905 Segmental and somatic dysfunction of pelvic region: Secondary | ICD-10-CM | POA: Diagnosis not present

## 2016-10-09 DIAGNOSIS — M9904 Segmental and somatic dysfunction of sacral region: Secondary | ICD-10-CM | POA: Diagnosis not present

## 2016-10-31 DIAGNOSIS — G4733 Obstructive sleep apnea (adult) (pediatric): Secondary | ICD-10-CM | POA: Diagnosis not present

## 2016-11-09 DIAGNOSIS — M25552 Pain in left hip: Secondary | ICD-10-CM | POA: Diagnosis not present

## 2016-11-09 DIAGNOSIS — M545 Low back pain: Secondary | ICD-10-CM | POA: Diagnosis not present

## 2016-11-15 DIAGNOSIS — M545 Low back pain: Secondary | ICD-10-CM | POA: Diagnosis not present

## 2016-11-15 DIAGNOSIS — M5416 Radiculopathy, lumbar region: Secondary | ICD-10-CM | POA: Diagnosis not present

## 2016-11-22 DIAGNOSIS — M5416 Radiculopathy, lumbar region: Secondary | ICD-10-CM | POA: Diagnosis not present

## 2016-11-22 DIAGNOSIS — M545 Low back pain: Secondary | ICD-10-CM | POA: Diagnosis not present

## 2016-11-24 ENCOUNTER — Ambulatory Visit (INDEPENDENT_AMBULATORY_CARE_PROVIDER_SITE_OTHER): Payer: PPO | Admitting: Orthopaedic Surgery

## 2016-11-24 DIAGNOSIS — M545 Low back pain: Secondary | ICD-10-CM | POA: Diagnosis not present

## 2016-11-24 DIAGNOSIS — M5416 Radiculopathy, lumbar region: Secondary | ICD-10-CM | POA: Diagnosis not present

## 2016-11-29 DIAGNOSIS — M545 Low back pain: Secondary | ICD-10-CM | POA: Diagnosis not present

## 2016-11-29 DIAGNOSIS — M5416 Radiculopathy, lumbar region: Secondary | ICD-10-CM | POA: Diagnosis not present

## 2016-12-01 DIAGNOSIS — M545 Low back pain: Secondary | ICD-10-CM | POA: Diagnosis not present

## 2016-12-01 DIAGNOSIS — G4733 Obstructive sleep apnea (adult) (pediatric): Secondary | ICD-10-CM | POA: Diagnosis not present

## 2016-12-01 DIAGNOSIS — M5416 Radiculopathy, lumbar region: Secondary | ICD-10-CM | POA: Diagnosis not present

## 2016-12-04 DIAGNOSIS — M5416 Radiculopathy, lumbar region: Secondary | ICD-10-CM | POA: Diagnosis not present

## 2016-12-04 DIAGNOSIS — M545 Low back pain: Secondary | ICD-10-CM | POA: Diagnosis not present

## 2016-12-13 DIAGNOSIS — M5416 Radiculopathy, lumbar region: Secondary | ICD-10-CM | POA: Diagnosis not present

## 2016-12-13 DIAGNOSIS — G4720 Circadian rhythm sleep disorder, unspecified type: Secondary | ICD-10-CM | POA: Diagnosis not present

## 2016-12-13 DIAGNOSIS — R5383 Other fatigue: Secondary | ICD-10-CM | POA: Diagnosis not present

## 2016-12-13 DIAGNOSIS — M545 Low back pain: Secondary | ICD-10-CM | POA: Diagnosis not present

## 2016-12-15 DIAGNOSIS — M545 Low back pain: Secondary | ICD-10-CM | POA: Diagnosis not present

## 2016-12-15 DIAGNOSIS — M5416 Radiculopathy, lumbar region: Secondary | ICD-10-CM | POA: Diagnosis not present

## 2016-12-22 DIAGNOSIS — M545 Low back pain: Secondary | ICD-10-CM | POA: Diagnosis not present

## 2016-12-29 DIAGNOSIS — M545 Low back pain: Secondary | ICD-10-CM | POA: Diagnosis not present

## 2016-12-31 DIAGNOSIS — G4733 Obstructive sleep apnea (adult) (pediatric): Secondary | ICD-10-CM | POA: Diagnosis not present

## 2017-01-03 DIAGNOSIS — M545 Low back pain: Secondary | ICD-10-CM | POA: Diagnosis not present

## 2017-01-03 DIAGNOSIS — E7800 Pure hypercholesterolemia, unspecified: Secondary | ICD-10-CM | POA: Diagnosis not present

## 2017-01-08 DIAGNOSIS — E7800 Pure hypercholesterolemia, unspecified: Secondary | ICD-10-CM | POA: Diagnosis not present

## 2017-01-08 DIAGNOSIS — G4700 Insomnia, unspecified: Secondary | ICD-10-CM | POA: Diagnosis not present

## 2017-01-10 DIAGNOSIS — M545 Low back pain: Secondary | ICD-10-CM | POA: Diagnosis not present

## 2017-01-24 DIAGNOSIS — M545 Low back pain: Secondary | ICD-10-CM | POA: Diagnosis not present

## 2017-01-26 DIAGNOSIS — M545 Low back pain: Secondary | ICD-10-CM | POA: Diagnosis not present

## 2017-01-31 DIAGNOSIS — G4733 Obstructive sleep apnea (adult) (pediatric): Secondary | ICD-10-CM | POA: Diagnosis not present

## 2017-01-31 DIAGNOSIS — M545 Low back pain: Secondary | ICD-10-CM | POA: Diagnosis not present

## 2017-02-02 DIAGNOSIS — M545 Low back pain: Secondary | ICD-10-CM | POA: Diagnosis not present

## 2017-02-07 DIAGNOSIS — M545 Low back pain: Secondary | ICD-10-CM | POA: Diagnosis not present

## 2017-03-03 DIAGNOSIS — G4733 Obstructive sleep apnea (adult) (pediatric): Secondary | ICD-10-CM | POA: Diagnosis not present

## 2017-03-05 DIAGNOSIS — M1711 Unilateral primary osteoarthritis, right knee: Secondary | ICD-10-CM | POA: Diagnosis not present

## 2017-03-05 DIAGNOSIS — M25561 Pain in right knee: Secondary | ICD-10-CM | POA: Diagnosis not present

## 2017-03-05 DIAGNOSIS — M25461 Effusion, right knee: Secondary | ICD-10-CM | POA: Diagnosis not present

## 2017-03-06 DIAGNOSIS — N5201 Erectile dysfunction due to arterial insufficiency: Secondary | ICD-10-CM | POA: Diagnosis not present

## 2017-03-06 DIAGNOSIS — R5383 Other fatigue: Secondary | ICD-10-CM | POA: Diagnosis not present

## 2017-03-06 DIAGNOSIS — Z125 Encounter for screening for malignant neoplasm of prostate: Secondary | ICD-10-CM | POA: Diagnosis not present

## 2017-03-12 DIAGNOSIS — M1990 Unspecified osteoarthritis, unspecified site: Secondary | ICD-10-CM | POA: Diagnosis not present

## 2017-03-12 DIAGNOSIS — M1180 Other specified crystal arthropathies, unspecified site: Secondary | ICD-10-CM | POA: Diagnosis not present

## 2017-03-12 DIAGNOSIS — M25569 Pain in unspecified knee: Secondary | ICD-10-CM | POA: Diagnosis not present
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
## 2017-04-02 DIAGNOSIS — G4733 Obstructive sleep apnea (adult) (pediatric): Secondary | ICD-10-CM | POA: Diagnosis not present

## 2017-04-16 DIAGNOSIS — N5201 Erectile dysfunction due to arterial insufficiency: Secondary | ICD-10-CM | POA: Diagnosis not present

## 2017-05-03 DIAGNOSIS — G4733 Obstructive sleep apnea (adult) (pediatric): Secondary | ICD-10-CM | POA: Diagnosis not present

## 2017-05-14 DIAGNOSIS — G4733 Obstructive sleep apnea (adult) (pediatric): Secondary | ICD-10-CM | POA: Diagnosis not present

## 2017-05-23 DIAGNOSIS — Z79899 Other long term (current) drug therapy: Secondary | ICD-10-CM | POA: Diagnosis not present

## 2017-06-06 DIAGNOSIS — J3489 Other specified disorders of nose and nasal sinuses: Secondary | ICD-10-CM | POA: Diagnosis not present

## 2017-06-06 DIAGNOSIS — R51 Headache: Secondary | ICD-10-CM | POA: Diagnosis not present

## 2017-06-11 DIAGNOSIS — R51 Headache: Secondary | ICD-10-CM | POA: Diagnosis not present

## 2017-06-11 DIAGNOSIS — J329 Chronic sinusitis, unspecified: Secondary | ICD-10-CM | POA: Diagnosis not present

## 2017-06-11 DIAGNOSIS — J320 Chronic maxillary sinusitis: Secondary | ICD-10-CM | POA: Diagnosis not present

## 2017-06-27 ENCOUNTER — Telehealth: Payer: Self-pay | Admitting: Diagnostic Neuroimaging

## 2017-06-27 NOTE — Telephone Encounter (Signed)
Received call from patient stating that Dr  Hassell Done, ENT with [facility] was to send a referral over for patient o see Dr Leta Baptist again. Last seen June 2017. He stated he has not heard back about this referral. This RN stated will take his information to referral dept and request they call him. He verbalized understanding, appreciation.  Spoke with [facility] in referrals. She has referral and will call patient.

## 2017-06-27 NOTE — Telephone Encounter (Signed)
Error

## 2017-07-04 DIAGNOSIS — L821 Other seborrheic keratosis: Secondary | ICD-10-CM | POA: Diagnosis not present

## 2017-07-04 DIAGNOSIS — L570 Actinic keratosis: Secondary | ICD-10-CM | POA: Diagnosis not present

## 2017-07-04 DIAGNOSIS — D225 Melanocytic nevi of trunk: Secondary | ICD-10-CM | POA: Diagnosis not present

## 2017-07-11 DIAGNOSIS — G4733 Obstructive sleep apnea (adult) (pediatric): Secondary | ICD-10-CM | POA: Diagnosis not present

## 2017-07-23 DIAGNOSIS — M542 Cervicalgia: Secondary | ICD-10-CM | POA: Diagnosis not present

## 2017-07-25 DIAGNOSIS — M542 Cervicalgia: Secondary | ICD-10-CM | POA: Diagnosis not present

## 2017-07-25 DIAGNOSIS — M47812 Spondylosis without myelopathy or radiculopathy, cervical region: Secondary | ICD-10-CM | POA: Diagnosis not present

## 2017-07-26 DIAGNOSIS — M47812 Spondylosis without myelopathy or radiculopathy, cervical region: Secondary | ICD-10-CM | POA: Diagnosis not present

## 2017-07-26 DIAGNOSIS — M542 Cervicalgia: Secondary | ICD-10-CM | POA: Diagnosis not present

## 2017-07-27 DIAGNOSIS — M542 Cervicalgia: Secondary | ICD-10-CM | POA: Diagnosis not present

## 2017-07-27 DIAGNOSIS — Z125 Encounter for screening for malignant neoplasm of prostate: Secondary | ICD-10-CM | POA: Diagnosis not present

## 2017-07-27 DIAGNOSIS — E7800 Pure hypercholesterolemia, unspecified: Secondary | ICD-10-CM | POA: Diagnosis not present

## 2017-07-27 DIAGNOSIS — M47812 Spondylosis without myelopathy or radiculopathy, cervical region: Secondary | ICD-10-CM | POA: Diagnosis not present

## 2017-07-27 DIAGNOSIS — I10 Essential (primary) hypertension: Secondary | ICD-10-CM | POA: Diagnosis not present

## 2017-08-08 DIAGNOSIS — M47812 Spondylosis without myelopathy or radiculopathy, cervical region: Secondary | ICD-10-CM | POA: Diagnosis not present

## 2017-08-08 DIAGNOSIS — G2581 Restless legs syndrome: Secondary | ICD-10-CM | POA: Diagnosis not present

## 2017-08-08 DIAGNOSIS — G43109 Migraine with aura, not intractable, without status migrainosus: Secondary | ICD-10-CM | POA: Diagnosis not present

## 2017-08-08 DIAGNOSIS — G43909 Migraine, unspecified, not intractable, without status migrainosus: Secondary | ICD-10-CM | POA: Diagnosis not present

## 2017-08-08 DIAGNOSIS — E782 Mixed hyperlipidemia: Secondary | ICD-10-CM | POA: Diagnosis not present

## 2017-08-08 DIAGNOSIS — I10 Essential (primary) hypertension: Secondary | ICD-10-CM | POA: Diagnosis not present

## 2017-08-08 DIAGNOSIS — R931 Abnormal findings on diagnostic imaging of heart and coronary circulation: Secondary | ICD-10-CM | POA: Diagnosis not present

## 2017-08-08 DIAGNOSIS — G4733 Obstructive sleep apnea (adult) (pediatric): Secondary | ICD-10-CM | POA: Diagnosis not present

## 2017-08-08 DIAGNOSIS — Z6830 Body mass index (BMI) 30.0-30.9, adult: Secondary | ICD-10-CM | POA: Diagnosis not present

## 2017-08-08 DIAGNOSIS — Z0001 Encounter for general adult medical examination with abnormal findings: Secondary | ICD-10-CM | POA: Diagnosis not present

## 2017-08-08 DIAGNOSIS — K219 Gastro-esophageal reflux disease without esophagitis: Secondary | ICD-10-CM | POA: Diagnosis not present

## 2017-08-08 DIAGNOSIS — M542 Cervicalgia: Secondary | ICD-10-CM | POA: Diagnosis not present

## 2017-08-08 DIAGNOSIS — K449 Diaphragmatic hernia without obstruction or gangrene: Secondary | ICD-10-CM | POA: Diagnosis not present

## 2017-08-08 DIAGNOSIS — F411 Generalized anxiety disorder: Secondary | ICD-10-CM | POA: Diagnosis not present

## 2017-08-10 DIAGNOSIS — M542 Cervicalgia: Secondary | ICD-10-CM | POA: Diagnosis not present

## 2017-08-10 DIAGNOSIS — M47812 Spondylosis without myelopathy or radiculopathy, cervical region: Secondary | ICD-10-CM | POA: Diagnosis not present

## 2017-08-13 DIAGNOSIS — M542 Cervicalgia: Secondary | ICD-10-CM | POA: Diagnosis not present

## 2017-08-13 DIAGNOSIS — M47812 Spondylosis without myelopathy or radiculopathy, cervical region: Secondary | ICD-10-CM | POA: Diagnosis not present

## 2017-08-15 DIAGNOSIS — M542 Cervicalgia: Secondary | ICD-10-CM | POA: Diagnosis not present

## 2017-08-15 DIAGNOSIS — M47812 Spondylosis without myelopathy or radiculopathy, cervical region: Secondary | ICD-10-CM | POA: Diagnosis not present

## 2017-08-17 ENCOUNTER — Institutional Professional Consult (permissible substitution): Payer: PPO | Admitting: Diagnostic Neuroimaging

## 2017-08-17 DIAGNOSIS — M542 Cervicalgia: Secondary | ICD-10-CM | POA: Diagnosis not present

## 2017-08-17 DIAGNOSIS — M47812 Spondylosis without myelopathy or radiculopathy, cervical region: Secondary | ICD-10-CM | POA: Diagnosis not present

## 2017-08-20 DIAGNOSIS — M25462 Effusion, left knee: Secondary | ICD-10-CM | POA: Diagnosis not present

## 2017-08-20 DIAGNOSIS — M25562 Pain in left knee: Secondary | ICD-10-CM | POA: Diagnosis not present

## 2017-08-20 DIAGNOSIS — G4733 Obstructive sleep apnea (adult) (pediatric): Secondary | ICD-10-CM | POA: Diagnosis not present

## 2017-08-22 DIAGNOSIS — M542 Cervicalgia: Secondary | ICD-10-CM | POA: Diagnosis not present

## 2017-08-22 DIAGNOSIS — M47812 Spondylosis without myelopathy or radiculopathy, cervical region: Secondary | ICD-10-CM | POA: Diagnosis not present

## 2017-08-24 DIAGNOSIS — M542 Cervicalgia: Secondary | ICD-10-CM | POA: Diagnosis not present

## 2017-08-24 DIAGNOSIS — M47812 Spondylosis without myelopathy or radiculopathy, cervical region: Secondary | ICD-10-CM | POA: Diagnosis not present

## 2017-09-05 DIAGNOSIS — M542 Cervicalgia: Secondary | ICD-10-CM | POA: Diagnosis not present

## 2017-09-05 DIAGNOSIS — M47812 Spondylosis without myelopathy or radiculopathy, cervical region: Secondary | ICD-10-CM | POA: Diagnosis not present

## 2017-09-21 DIAGNOSIS — R3915 Urgency of urination: Secondary | ICD-10-CM | POA: Diagnosis not present

## 2017-09-21 DIAGNOSIS — N5201 Erectile dysfunction due to arterial insufficiency: Secondary | ICD-10-CM | POA: Diagnosis not present

## 2017-09-28 DIAGNOSIS — M25561 Pain in right knee: Secondary | ICD-10-CM | POA: Diagnosis not present

## 2017-09-28 DIAGNOSIS — M25461 Effusion, right knee: Secondary | ICD-10-CM | POA: Diagnosis not present

## 2017-10-03 DIAGNOSIS — H0012 Chalazion right lower eyelid: Secondary | ICD-10-CM | POA: Diagnosis not present

## 2017-10-24 DIAGNOSIS — M2550 Pain in unspecified joint: Secondary | ICD-10-CM | POA: Diagnosis not present

## 2017-10-24 DIAGNOSIS — M25473 Effusion, unspecified ankle: Secondary | ICD-10-CM | POA: Diagnosis not present

## 2017-10-24 DIAGNOSIS — M545 Low back pain: Secondary | ICD-10-CM | POA: Diagnosis not present

## 2017-10-25 DIAGNOSIS — G4733 Obstructive sleep apnea (adult) (pediatric): Secondary | ICD-10-CM | POA: Diagnosis not present

## 2017-10-29 DIAGNOSIS — M549 Dorsalgia, unspecified: Secondary | ICD-10-CM | POA: Diagnosis not present

## 2017-10-29 DIAGNOSIS — M25552 Pain in left hip: Secondary | ICD-10-CM | POA: Diagnosis not present

## 2017-10-29 DIAGNOSIS — M25551 Pain in right hip: Secondary | ICD-10-CM | POA: Diagnosis not present

## 2017-10-29 DIAGNOSIS — M1180 Other specified crystal arthropathies, unspecified site: Secondary | ICD-10-CM | POA: Diagnosis not present

## 2017-10-29 DIAGNOSIS — R700 Elevated erythrocyte sedimentation rate: Secondary | ICD-10-CM | POA: Diagnosis not present

## 2017-10-29 DIAGNOSIS — B0052 Herpesviral keratitis: Secondary | ICD-10-CM | POA: Diagnosis not present

## 2017-10-29 DIAGNOSIS — M25559 Pain in unspecified hip: Secondary | ICD-10-CM | POA: Diagnosis not present

## 2017-10-29 DIAGNOSIS — M2550 Pain in unspecified joint: Secondary | ICD-10-CM | POA: Diagnosis not present

## 2017-10-29 DIAGNOSIS — M545 Low back pain: Secondary | ICD-10-CM | POA: Diagnosis not present

## 2017-11-02 DIAGNOSIS — B0052 Herpesviral keratitis: Secondary | ICD-10-CM | POA: Diagnosis not present

## 2017-11-05 DIAGNOSIS — M47816 Spondylosis without myelopathy or radiculopathy, lumbar region: Secondary | ICD-10-CM | POA: Diagnosis not present

## 2017-11-05 DIAGNOSIS — M545 Low back pain: Secondary | ICD-10-CM | POA: Diagnosis not present

## 2017-11-06 DIAGNOSIS — M47816 Spondylosis without myelopathy or radiculopathy, lumbar region: Secondary | ICD-10-CM | POA: Diagnosis not present

## 2017-11-06 DIAGNOSIS — M545 Low back pain: Secondary | ICD-10-CM | POA: Diagnosis not present

## 2017-11-07 DIAGNOSIS — M47816 Spondylosis without myelopathy or radiculopathy, lumbar region: Secondary | ICD-10-CM | POA: Diagnosis not present

## 2017-11-07 DIAGNOSIS — M545 Low back pain: Secondary | ICD-10-CM | POA: Diagnosis not present

## 2017-11-09 DIAGNOSIS — B0052 Herpesviral keratitis: Secondary | ICD-10-CM | POA: Diagnosis not present

## 2017-11-21 DIAGNOSIS — M47816 Spondylosis without myelopathy or radiculopathy, lumbar region: Secondary | ICD-10-CM | POA: Diagnosis not present

## 2017-11-21 DIAGNOSIS — M545 Low back pain: Secondary | ICD-10-CM | POA: Diagnosis not present

## 2017-11-28 DIAGNOSIS — M545 Low back pain: Secondary | ICD-10-CM | POA: Diagnosis not present

## 2017-11-28 DIAGNOSIS — M47816 Spondylosis without myelopathy or radiculopathy, lumbar region: Secondary | ICD-10-CM | POA: Diagnosis not present

## 2017-12-10 DIAGNOSIS — R3915 Urgency of urination: Secondary | ICD-10-CM | POA: Diagnosis not present

## 2017-12-10 DIAGNOSIS — R5383 Other fatigue: Secondary | ICD-10-CM | POA: Diagnosis not present

## 2017-12-10 DIAGNOSIS — N5201 Erectile dysfunction due to arterial insufficiency: Secondary | ICD-10-CM | POA: Diagnosis not present

## 2018-01-01 DIAGNOSIS — N5201 Erectile dysfunction due to arterial insufficiency: Secondary | ICD-10-CM | POA: Diagnosis not present

## 2018-01-25 DIAGNOSIS — R931 Abnormal findings on diagnostic imaging of heart and coronary circulation: Secondary | ICD-10-CM | POA: Diagnosis not present

## 2018-01-25 DIAGNOSIS — I10 Essential (primary) hypertension: Secondary | ICD-10-CM | POA: Diagnosis not present

## 2018-01-25 DIAGNOSIS — E782 Mixed hyperlipidemia: Secondary | ICD-10-CM | POA: Diagnosis not present

## 2018-02-01 DIAGNOSIS — M4126 Other idiopathic scoliosis, lumbar region: Secondary | ICD-10-CM | POA: Diagnosis not present

## 2018-02-01 DIAGNOSIS — M5186 Other intervertebral disc disorders, lumbar region: Secondary | ICD-10-CM | POA: Diagnosis not present

## 2018-02-11 DIAGNOSIS — M4126 Other idiopathic scoliosis, lumbar region: Secondary | ICD-10-CM | POA: Diagnosis not present

## 2018-02-11 DIAGNOSIS — M5186 Other intervertebral disc disorders, lumbar region: Secondary | ICD-10-CM | POA: Diagnosis not present

## 2018-02-13 DIAGNOSIS — M4126 Other idiopathic scoliosis, lumbar region: Secondary | ICD-10-CM | POA: Diagnosis not present

## 2018-02-13 DIAGNOSIS — M5186 Other intervertebral disc disorders, lumbar region: Secondary | ICD-10-CM | POA: Diagnosis not present

## 2018-02-18 DIAGNOSIS — M5186 Other intervertebral disc disorders, lumbar region: Secondary | ICD-10-CM | POA: Diagnosis not present

## 2018-02-18 DIAGNOSIS — M4126 Other idiopathic scoliosis, lumbar region: Secondary | ICD-10-CM | POA: Diagnosis not present

## 2018-02-20 DIAGNOSIS — M5186 Other intervertebral disc disorders, lumbar region: Secondary | ICD-10-CM | POA: Diagnosis not present

## 2018-02-20 DIAGNOSIS — M4126 Other idiopathic scoliosis, lumbar region: Secondary | ICD-10-CM | POA: Diagnosis not present

## 2018-02-27 DIAGNOSIS — M4126 Other idiopathic scoliosis, lumbar region: Secondary | ICD-10-CM | POA: Diagnosis not present

## 2018-02-27 DIAGNOSIS — M5186 Other intervertebral disc disorders, lumbar region: Secondary | ICD-10-CM | POA: Diagnosis not present

## 2018-02-28 DIAGNOSIS — R51 Headache: Secondary | ICD-10-CM | POA: Diagnosis not present

## 2018-03-01 DIAGNOSIS — M5186 Other intervertebral disc disorders, lumbar region: Secondary | ICD-10-CM | POA: Diagnosis not present

## 2018-03-01 DIAGNOSIS — M4126 Other idiopathic scoliosis, lumbar region: Secondary | ICD-10-CM | POA: Diagnosis not present

## 2018-03-04 DIAGNOSIS — M5186 Other intervertebral disc disorders, lumbar region: Secondary | ICD-10-CM | POA: Diagnosis not present

## 2018-03-04 DIAGNOSIS — M4126 Other idiopathic scoliosis, lumbar region: Secondary | ICD-10-CM | POA: Diagnosis not present

## 2018-03-12 DIAGNOSIS — E7800 Pure hypercholesterolemia, unspecified: Secondary | ICD-10-CM | POA: Diagnosis not present

## 2018-03-26 DIAGNOSIS — M25522 Pain in left elbow: Secondary | ICD-10-CM | POA: Diagnosis not present

## 2018-03-27 DIAGNOSIS — E782 Mixed hyperlipidemia: Secondary | ICD-10-CM | POA: Diagnosis not present

## 2018-04-02 DIAGNOSIS — J320 Chronic maxillary sinusitis: Secondary | ICD-10-CM | POA: Diagnosis not present

## 2018-04-02 DIAGNOSIS — J343 Hypertrophy of nasal turbinates: Secondary | ICD-10-CM | POA: Diagnosis not present

## 2018-04-08 DIAGNOSIS — H43812 Vitreous degeneration, left eye: Secondary | ICD-10-CM | POA: Diagnosis not present

## 2018-04-17 DIAGNOSIS — J3489 Other specified disorders of nose and nasal sinuses: Secondary | ICD-10-CM | POA: Diagnosis not present

## 2018-04-17 DIAGNOSIS — R51 Headache: Secondary | ICD-10-CM | POA: Diagnosis not present

## 2018-04-24 DIAGNOSIS — E782 Mixed hyperlipidemia: Secondary | ICD-10-CM | POA: Diagnosis not present

## 2018-04-29 DIAGNOSIS — M79602 Pain in left arm: Secondary | ICD-10-CM | POA: Diagnosis not present

## 2018-04-29 DIAGNOSIS — M7712 Lateral epicondylitis, left elbow: Secondary | ICD-10-CM | POA: Diagnosis not present

## 2018-05-10 DIAGNOSIS — I10 Essential (primary) hypertension: Secondary | ICD-10-CM | POA: Diagnosis not present

## 2018-05-10 DIAGNOSIS — E782 Mixed hyperlipidemia: Secondary | ICD-10-CM | POA: Diagnosis not present

## 2018-05-10 DIAGNOSIS — H0014 Chalazion left upper eyelid: Secondary | ICD-10-CM | POA: Diagnosis not present

## 2018-05-13 DIAGNOSIS — G4733 Obstructive sleep apnea (adult) (pediatric): Secondary | ICD-10-CM | POA: Diagnosis not present

## 2018-05-15 DIAGNOSIS — Z9989 Dependence on other enabling machines and devices: Secondary | ICD-10-CM | POA: Diagnosis not present

## 2018-05-15 DIAGNOSIS — Z7289 Other problems related to lifestyle: Secondary | ICD-10-CM | POA: Diagnosis not present

## 2018-05-15 DIAGNOSIS — J3089 Other allergic rhinitis: Secondary | ICD-10-CM | POA: Diagnosis not present

## 2018-05-27 DIAGNOSIS — M7712 Lateral epicondylitis, left elbow: Secondary | ICD-10-CM | POA: Diagnosis not present

## 2018-05-29 DIAGNOSIS — M7712 Lateral epicondylitis, left elbow: Secondary | ICD-10-CM | POA: Diagnosis not present

## 2018-06-04 DIAGNOSIS — M7712 Lateral epicondylitis, left elbow: Secondary | ICD-10-CM | POA: Diagnosis not present

## 2018-06-07 DIAGNOSIS — M7712 Lateral epicondylitis, left elbow: Secondary | ICD-10-CM | POA: Diagnosis not present

## 2018-06-18 DIAGNOSIS — M7712 Lateral epicondylitis, left elbow: Secondary | ICD-10-CM | POA: Diagnosis not present

## 2018-06-25 DIAGNOSIS — M7712 Lateral epicondylitis, left elbow: Secondary | ICD-10-CM | POA: Diagnosis not present

## 2018-06-28 DIAGNOSIS — M7712 Lateral epicondylitis, left elbow: Secondary | ICD-10-CM | POA: Diagnosis not present

## 2018-07-03 DIAGNOSIS — E7800 Pure hypercholesterolemia, unspecified: Secondary | ICD-10-CM | POA: Diagnosis not present

## 2018-07-03 DIAGNOSIS — Z79899 Other long term (current) drug therapy: Secondary | ICD-10-CM | POA: Diagnosis not present

## 2018-07-09 DIAGNOSIS — M25522 Pain in left elbow: Secondary | ICD-10-CM | POA: Diagnosis not present

## 2018-07-10 DIAGNOSIS — D485 Neoplasm of uncertain behavior of skin: Secondary | ICD-10-CM | POA: Diagnosis not present

## 2018-07-10 DIAGNOSIS — C44619 Basal cell carcinoma of skin of left upper limb, including shoulder: Secondary | ICD-10-CM | POA: Diagnosis not present

## 2018-07-10 DIAGNOSIS — C44519 Basal cell carcinoma of skin of other part of trunk: Secondary | ICD-10-CM | POA: Diagnosis not present

## 2018-07-10 DIAGNOSIS — I10 Essential (primary) hypertension: Secondary | ICD-10-CM | POA: Diagnosis not present

## 2018-07-10 DIAGNOSIS — E782 Mixed hyperlipidemia: Secondary | ICD-10-CM | POA: Diagnosis not present

## 2018-07-10 DIAGNOSIS — D225 Melanocytic nevi of trunk: Secondary | ICD-10-CM | POA: Diagnosis not present

## 2018-07-10 DIAGNOSIS — L570 Actinic keratosis: Secondary | ICD-10-CM | POA: Diagnosis not present

## 2018-07-10 DIAGNOSIS — L821 Other seborrheic keratosis: Secondary | ICD-10-CM | POA: Diagnosis not present

## 2018-07-11 DIAGNOSIS — M7712 Lateral epicondylitis, left elbow: Secondary | ICD-10-CM | POA: Diagnosis not present

## 2018-07-23 ENCOUNTER — Ambulatory Visit: Payer: PPO | Admitting: Cardiology

## 2018-07-23 DIAGNOSIS — M7712 Lateral epicondylitis, left elbow: Secondary | ICD-10-CM | POA: Diagnosis not present

## 2018-07-23 DIAGNOSIS — S6992XA Unspecified injury of left wrist, hand and finger(s), initial encounter: Secondary | ICD-10-CM | POA: Diagnosis not present

## 2018-07-26 DIAGNOSIS — M79645 Pain in left finger(s): Secondary | ICD-10-CM | POA: Diagnosis not present

## 2018-07-26 DIAGNOSIS — M19032 Primary osteoarthritis, left wrist: Secondary | ICD-10-CM | POA: Diagnosis not present

## 2018-07-31 DIAGNOSIS — Z9889 Other specified postprocedural states: Secondary | ICD-10-CM | POA: Diagnosis not present

## 2018-08-09 DIAGNOSIS — I10 Essential (primary) hypertension: Secondary | ICD-10-CM | POA: Diagnosis not present

## 2018-08-09 DIAGNOSIS — D649 Anemia, unspecified: Secondary | ICD-10-CM | POA: Diagnosis not present

## 2018-08-09 DIAGNOSIS — E7800 Pure hypercholesterolemia, unspecified: Secondary | ICD-10-CM | POA: Diagnosis not present

## 2018-08-09 DIAGNOSIS — Z125 Encounter for screening for malignant neoplasm of prostate: Secondary | ICD-10-CM | POA: Diagnosis not present

## 2018-08-14 DIAGNOSIS — J309 Allergic rhinitis, unspecified: Secondary | ICD-10-CM | POA: Diagnosis not present

## 2018-08-14 DIAGNOSIS — K219 Gastro-esophageal reflux disease without esophagitis: Secondary | ICD-10-CM | POA: Diagnosis not present

## 2018-08-14 DIAGNOSIS — Z0000 Encounter for general adult medical examination without abnormal findings: Secondary | ICD-10-CM | POA: Diagnosis not present

## 2018-08-14 DIAGNOSIS — R931 Abnormal findings on diagnostic imaging of heart and coronary circulation: Secondary | ICD-10-CM | POA: Diagnosis not present

## 2018-08-14 DIAGNOSIS — I10 Essential (primary) hypertension: Secondary | ICD-10-CM | POA: Diagnosis not present

## 2018-08-14 DIAGNOSIS — G43109 Migraine with aura, not intractable, without status migrainosus: Secondary | ICD-10-CM | POA: Diagnosis not present

## 2018-08-14 DIAGNOSIS — E782 Mixed hyperlipidemia: Secondary | ICD-10-CM | POA: Diagnosis not present

## 2018-08-14 DIAGNOSIS — G4722 Circadian rhythm sleep disorder, advanced sleep phase type: Secondary | ICD-10-CM | POA: Diagnosis not present

## 2018-08-14 DIAGNOSIS — Z7982 Long term (current) use of aspirin: Secondary | ICD-10-CM | POA: Diagnosis not present

## 2018-08-14 DIAGNOSIS — D649 Anemia, unspecified: Secondary | ICD-10-CM | POA: Diagnosis not present

## 2018-08-14 DIAGNOSIS — G2581 Restless legs syndrome: Secondary | ICD-10-CM | POA: Diagnosis not present

## 2018-08-14 DIAGNOSIS — G4733 Obstructive sleep apnea (adult) (pediatric): Secondary | ICD-10-CM | POA: Diagnosis not present

## 2018-08-14 DIAGNOSIS — I2510 Atherosclerotic heart disease of native coronary artery without angina pectoris: Secondary | ICD-10-CM | POA: Diagnosis not present

## 2018-08-21 DIAGNOSIS — H10413 Chronic giant papillary conjunctivitis, bilateral: Secondary | ICD-10-CM | POA: Diagnosis not present

## 2018-08-27 DIAGNOSIS — R3915 Urgency of urination: Secondary | ICD-10-CM | POA: Diagnosis not present

## 2018-08-27 DIAGNOSIS — R5383 Other fatigue: Secondary | ICD-10-CM | POA: Diagnosis not present

## 2018-08-27 DIAGNOSIS — R972 Elevated prostate specific antigen [PSA]: Secondary | ICD-10-CM | POA: Diagnosis not present

## 2018-08-27 DIAGNOSIS — N5201 Erectile dysfunction due to arterial insufficiency: Secondary | ICD-10-CM | POA: Diagnosis not present

## 2018-09-04 DIAGNOSIS — Z9889 Other specified postprocedural states: Secondary | ICD-10-CM | POA: Diagnosis not present

## 2018-09-04 DIAGNOSIS — H10413 Chronic giant papillary conjunctivitis, bilateral: Secondary | ICD-10-CM | POA: Diagnosis not present

## 2018-09-11 DIAGNOSIS — M25622 Stiffness of left elbow, not elsewhere classified: Secondary | ICD-10-CM | POA: Diagnosis not present

## 2018-09-11 DIAGNOSIS — M7712 Lateral epicondylitis, left elbow: Secondary | ICD-10-CM | POA: Diagnosis not present

## 2018-09-12 DIAGNOSIS — G4733 Obstructive sleep apnea (adult) (pediatric): Secondary | ICD-10-CM | POA: Diagnosis not present

## 2018-09-13 DIAGNOSIS — M7712 Lateral epicondylitis, left elbow: Secondary | ICD-10-CM | POA: Diagnosis not present

## 2018-09-13 DIAGNOSIS — M25622 Stiffness of left elbow, not elsewhere classified: Secondary | ICD-10-CM | POA: Diagnosis not present

## 2018-09-18 DIAGNOSIS — M7712 Lateral epicondylitis, left elbow: Secondary | ICD-10-CM | POA: Diagnosis not present

## 2018-09-18 DIAGNOSIS — M25622 Stiffness of left elbow, not elsewhere classified: Secondary | ICD-10-CM | POA: Diagnosis not present

## 2018-09-25 DIAGNOSIS — E7800 Pure hypercholesterolemia, unspecified: Secondary | ICD-10-CM | POA: Diagnosis not present

## 2018-09-25 DIAGNOSIS — H524 Presbyopia: Secondary | ICD-10-CM | POA: Diagnosis not present

## 2018-09-25 DIAGNOSIS — M7712 Lateral epicondylitis, left elbow: Secondary | ICD-10-CM | POA: Diagnosis not present

## 2018-09-25 DIAGNOSIS — Z23 Encounter for immunization: Secondary | ICD-10-CM | POA: Diagnosis not present

## 2018-09-25 DIAGNOSIS — S61210D Laceration without foreign body of right index finger without damage to nail, subsequent encounter: Secondary | ICD-10-CM | POA: Diagnosis not present

## 2018-09-25 DIAGNOSIS — Z0000 Encounter for general adult medical examination without abnormal findings: Secondary | ICD-10-CM | POA: Diagnosis not present

## 2018-09-25 DIAGNOSIS — E113393 Type 2 diabetes mellitus with moderate nonproliferative diabetic retinopathy without macular edema, bilateral: Secondary | ICD-10-CM | POA: Diagnosis not present

## 2018-10-02 ENCOUNTER — Telehealth: Payer: Self-pay

## 2018-10-02 NOTE — Telephone Encounter (Signed)
Contacted pt to change appointment to virtual visit. Pt states his PCP recently started him on repatha, and it's dropped his cholesterol numbers tremendously. He states, he was planning to call office to cancel visit. Offered to reschedule appointment but pt declined.

## 2018-10-09 ENCOUNTER — Ambulatory Visit: Payer: PPO | Admitting: Internal Medicine

## 2018-10-16 DIAGNOSIS — Z9889 Other specified postprocedural states: Secondary | ICD-10-CM | POA: Diagnosis not present

## 2018-12-03 DIAGNOSIS — M79641 Pain in right hand: Secondary | ICD-10-CM | POA: Diagnosis not present

## 2018-12-03 DIAGNOSIS — M79642 Pain in left hand: Secondary | ICD-10-CM | POA: Diagnosis not present

## 2019-01-27 ENCOUNTER — Other Ambulatory Visit: Payer: Self-pay

## 2019-03-05 DIAGNOSIS — R972 Elevated prostate specific antigen [PSA]: Secondary | ICD-10-CM | POA: Diagnosis not present

## 2019-03-10 DIAGNOSIS — N5201 Erectile dysfunction due to arterial insufficiency: Secondary | ICD-10-CM | POA: Diagnosis not present

## 2019-03-10 DIAGNOSIS — R3915 Urgency of urination: Secondary | ICD-10-CM | POA: Diagnosis not present

## 2019-03-10 DIAGNOSIS — R972 Elevated prostate specific antigen [PSA]: Secondary | ICD-10-CM | POA: Diagnosis not present

## 2019-03-14 DIAGNOSIS — J320 Chronic maxillary sinusitis: Secondary | ICD-10-CM | POA: Diagnosis not present

## 2019-03-14 DIAGNOSIS — J343 Hypertrophy of nasal turbinates: Secondary | ICD-10-CM | POA: Diagnosis not present

## 2019-03-14 DIAGNOSIS — R51 Headache: Secondary | ICD-10-CM | POA: Diagnosis not present

## 2019-05-05 DIAGNOSIS — R071 Chest pain on breathing: Secondary | ICD-10-CM | POA: Diagnosis not present

## 2019-05-05 DIAGNOSIS — M5414 Radiculopathy, thoracic region: Secondary | ICD-10-CM | POA: Diagnosis not present

## 2019-07-02 DIAGNOSIS — M6208 Separation of muscle (nontraumatic), other site: Secondary | ICD-10-CM | POA: Diagnosis not present

## 2019-07-16 DIAGNOSIS — R509 Fever, unspecified: Secondary | ICD-10-CM | POA: Diagnosis not present

## 2019-07-21 ENCOUNTER — Ambulatory Visit: Payer: PPO | Attending: Internal Medicine

## 2019-07-21 DIAGNOSIS — Z23 Encounter for immunization: Secondary | ICD-10-CM | POA: Insufficient documentation

## 2019-07-21 NOTE — Progress Notes (Signed)
   Covid-19 Vaccination Clinic  Name:  Adrian Skinner    MRN: [id number] DOB: 03-May-1946  07/21/2019  Mr. Adrian Skinner was observed post Covid-19 immunization for 15 minutes without incidence. He was provided with Vaccine Information Sheet and instruction to access the V-Safe system.   Mr. Adrian Skinner was instructed to call 911 with any severe reactions post vaccine: Marland Kitchen Difficulty breathing  . Swelling of your face and throat  . A fast heartbeat  . A bad rash all over your body  . Dizziness and weakness    Immunizations Administered    Name Date Dose VIS Date Route   Pfizer COVID-19 Vaccine 07/21/2019  2:20 PM 0.3 mL 06/06/2019 Intramuscular   Manufacturer: [place]   Lot: [id number]   [place]: [id number]

## 2019-07-23 DIAGNOSIS — Z85828 Personal history of other malignant neoplasm of skin: Secondary | ICD-10-CM | POA: Diagnosis not present

## 2019-07-23 DIAGNOSIS — L821 Other seborrheic keratosis: Secondary | ICD-10-CM | POA: Diagnosis not present

## 2019-07-23 DIAGNOSIS — D2262 Melanocytic nevi of left upper limb, including shoulder: Secondary | ICD-10-CM | POA: Diagnosis not present

## 2019-07-23 DIAGNOSIS — L570 Actinic keratosis: Secondary | ICD-10-CM | POA: Diagnosis not present

## 2019-07-23 DIAGNOSIS — D225 Melanocytic nevi of trunk: Secondary | ICD-10-CM | POA: Diagnosis not present

## 2019-08-06 ENCOUNTER — Ambulatory Visit: Payer: PPO

## 2019-08-06 DIAGNOSIS — G4733 Obstructive sleep apnea (adult) (pediatric): Secondary | ICD-10-CM | POA: Diagnosis not present

## 2019-08-06 DIAGNOSIS — G4722 Circadian rhythm sleep disorder, advanced sleep phase type: Secondary | ICD-10-CM | POA: Diagnosis not present

## 2019-08-08 ENCOUNTER — Ambulatory Visit: Payer: PPO | Attending: Internal Medicine

## 2019-08-08 DIAGNOSIS — Z23 Encounter for immunization: Secondary | ICD-10-CM | POA: Insufficient documentation

## 2019-08-08 NOTE — Progress Notes (Signed)
   Covid-19 Vaccination Clinic  Name:  Adrian Skinner    MRN: [id number] DOB: September 11, 1945  08/08/2019  Adrian Skinner was observed post Covid-19 immunization for 15 minutes without incidence. He was provided with Vaccine Information Sheet and instruction to access the V-Safe system.   Adrian Skinner was instructed to call 911 with any severe reactions post vaccine: Marland Kitchen Difficulty breathing  . Swelling of your face and throat  . A fast heartbeat  . A bad rash all over your body  . Dizziness and weakness    Immunizations Administered    Name Date Dose VIS Date Route   Pfizer COVID-19 Vaccine 08/08/2019 12:52 PM 0.3 mL 06/06/2019 Intramuscular   Manufacturer: [place]   Lot: [id number]   [place]: [id number]

## 2019-09-18 DIAGNOSIS — G4733 Obstructive sleep apnea (adult) (pediatric): Secondary | ICD-10-CM | POA: Diagnosis not present

## 2019-11-26 ENCOUNTER — Telehealth: Payer: Self-pay | Admitting: Cardiology

## 2019-11-26 DIAGNOSIS — R600 Localized edema: Secondary | ICD-10-CM | POA: Diagnosis not present

## 2019-11-26 NOTE — Telephone Encounter (Signed)
Wears support socks but has not been wearing for about 2 weeks secondary to having poison ivy  States he is extremely allergic to poison ivy Rash right side of ankle, is red but not raised  Swelling mostly goes down at night  Denies shortness of breath Does not take diuretic Advised to call PCP, has appointment with Daleen Snook K PA 11/28/2019

## 2019-11-26 NOTE — Telephone Encounter (Signed)
Pt c/o swelling: STAT is pt has developed SOB within 24 hours  1) How much weight have you gained and in what time span? no  2) If swelling, where is the swelling located? Swelling in both ankles, a rash has appeared on his right ankle now. 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  3) Are you currently taking a fluid pill? no  4) Are you currently SOB? no  5) Do you have a log of your daily weights (if so, list)? no  6) Have you gained 3 pounds in a day or 5 pounds in a week? no  7) Have you traveled recently? no

## 2019-11-28 ENCOUNTER — Ambulatory Visit: Payer: PPO | Admitting: Medical

## 2020-01-12 DIAGNOSIS — R5383 Other fatigue: Secondary | ICD-10-CM | POA: Diagnosis not present

## 2020-01-12 DIAGNOSIS — I10 Essential (primary) hypertension: Secondary | ICD-10-CM | POA: Diagnosis not present

## 2020-01-29 DIAGNOSIS — I2510 Atherosclerotic heart disease of native coronary artery without angina pectoris: Secondary | ICD-10-CM | POA: Insufficient documentation

## 2020-01-29 DIAGNOSIS — R0602 Shortness of breath: Secondary | ICD-10-CM | POA: Insufficient documentation

## 2020-01-29 DIAGNOSIS — Z7189 Other specified counseling: Secondary | ICD-10-CM | POA: Insufficient documentation

## 2020-01-29 DIAGNOSIS — I10 Essential (primary) hypertension: Secondary | ICD-10-CM | POA: Insufficient documentation

## 2020-01-29 NOTE — Progress Notes (Signed)
Cardiology Office Note   Date:  01/30/2020   ID:  Adrian Skinner, DOB March 23, 1946, MRN [id number]  PCP:  Deland Pretty, MD  Cardiologist:   Minus Breeding, MD   Chief Complaint  Patient presents with  . Edema      History of Present Illness: Adrian Skinner is a 74 y.o. male who presents for follow up of chest pain.  I saw the patient on 11/06/2013 with chest pain. His symptom was concerning for obstructive coronary artery disease. Stress test obtained on 11/07/2013 was intermediate risk with relatively small area of mild inferoseptal ischemia. He underwent cardiac catheterization on 11/11/2013 which showed EF 55-65%, 10-20% mid LAD lesion, no other significant coronary artery disease.  I have not seen him since 2017.     He presents because of lower extremity swelling.  He has noticed this since he switched to basically a desk job.  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  He does not do as much walking as he used to.  He does still do a little walking for exercise and golfs.  He notices that he has lower extremity swelling at the end of the day it goes away when he wakes up in the morning.  However, he is not having any cardiovascular symptoms otherwise.  The patient denies any new symptoms such as chest discomfort, neck or arm discomfort. There has been no new shortness of breath, PND or orthopnea. There have been no reported palpitations, presyncope or syncope.      Past Medical History:  Diagnosis Date  . Cataract   . Dizziness   . Environmental and seasonal allergies   . Essential hypertension   . Hyperlipidemia   . OSA (obstructive sleep apnea) 11/25/2014  . Reflux   . Vertigo     Past Surgical History:  Procedure Laterality Date  . LEFT HEART CATHETERIZATION WITH CORONARY ANGIOGRAM N/A 11/11/2013   Procedure: LEFT HEART CATHETERIZATION WITH CORONARY ANGIOGRAM;  Surgeon: Peter M Martinique, MD;  Location: [facility] CATH LAB;  Service: Cardiovascular;  Laterality: N/A;  . ROTATOR CUFF  REPAIR     right  . TONSILLECTOMY AND ADENOIDECTOMY     Family History  Adopted: Yes  Family history unknown: Yes   Social History   Socioeconomic History  . Marital status: Married    Spouse name: Not on file  . Number of children: 2  . Years of education: Bachelors  . Highest education level: Not on file  Occupational History  . Occupation: Retired  Tobacco Use  . Smoking status: Never Smoker  . Smokeless tobacco: Never Used  Substance and Sexual Activity  . Alcohol use: Yes    Alcohol/week: 0.0 standard drinks    Comment: glass of wine (2-4oz) daily  . Drug use: No  . Sexual activity: Not on file  Other Topics Concern  . Not on file  Social History Narrative   Retired.  Part time driver for [employer].  Lives at home with wife.       PT IS ADOPTED- NO MEDICAL HISTORY FOR FAMILY.      Right-handed.      2 cups caffeine per day.   Social Determinants of Health   Financial Resource Strain:   . Difficulty of Paying Living Expenses:   Food Insecurity:   . Worried About Charity fundraiser in the Last Year:   . [place] in the Last Year:   Transportation Needs:   . Lack of  Transportation (Medical):   Marland Kitchen Lack of Transportation (Non-Medical):   Physical Activity:   . Days of Exercise per Week:   . Minutes of Exercise per Session:   Stress:   . Feeling of Stress :   Social Connections:   . Frequency of Communication with Friends and Family:   . Frequency of Social Gatherings with Friends and Family:   . Attends Religious Services:   . Active Member of Clubs or Organizations:   . Attends Archivist Meetings:   Marland Kitchen Marital Status:      Current Outpatient Medications  Medication Sig Dispense Refill  . Evolocumab (REPATHA SURECLICK) 997 MG/ML SOAJ     . fluticasone (FLONASE) 50 MCG/ACT nasal spray Place 1 spray into both nostrils daily.    . Multiple Vitamin (MULTI-VITAMIN) tablet Take 1 tablet by mouth daily.    Marland Kitchen omeprazole (PRILOSEC) 40 MG capsule  Take by mouth.    . pramipexole (MIRAPEX) 0.5 MG tablet Take 1 mg by mouth daily.     . sertraline (ZOLOFT) 25 MG tablet Take 25 mg by mouth daily.    . tamsulosin (FLOMAX) 0.4 MG CAPS capsule     . triamterene-hydrochlorothiazide (MAXZIDE-25) 37.5-25 MG tablet      No current facility-administered medications for this visit.    Allergies:   Patient has no known allergies.    ROS:  Please see the history of present illness.   Otherwise, review of systems are positive for none.   All other systems are reviewed and negative.    PHYSICAL EXAM: VS:  BP 124/86 (BP Location: Left Arm, Patient Position: Sitting, Cuff Size: Normal)   Pulse 69   Ht 6' (1.829 m)   Wt 213 lb (96.6 kg)   BMI 28.89 kg/m  , BMI Body mass index is 28.89 kg/m. GENERAL:  Well appearing HEENT:  Pupils equal round and reactive, fundi not visualized, oral mucosa unremarkable NECK:  No jugular venous distention, waveform within normal limits, carotid upstroke brisk and symmetric, no bruits, no thyromegaly LYMPHATICS:  No cervical, inguinal adenopathy LUNGS:  Clear to auscultation bilaterally BACK:  No CVA tenderness CHEST:  Unremarkable HEART:  PMI not displaced or sustained,S1 and S2 within normal limits, no S3, no S4, no clicks, no rubs, no murmurs ABD:  Flat, positive bowel sounds normal in frequency in pitch, no bruits, no rebound, no guarding, no midline pulsatile mass, no hepatomegaly, no splenomegaly EXT:  2 plus pulses throughout, trace edema, no cyanosis no clubbing SKIN:  No rashes no nodules NEURO:  Cranial nerves II through XII grossly intact, motor grossly intact throughout PSYCH:  Cognitively intact, oriented to person place and time    EKG:  EKG is not ordered today.    Recent Labs: No results found for requested labs within last 8760 hours.    Lipid Panel No results found for: CHOL, TRIG, HDL, CHOLHDL, VLDL, LDLCALC, LDLDIRECT    Wt Readings from Last 3 Encounters:  01/30/20 213 lb  (96.6 kg)  05/25/16 207 lb (93.9 kg)  04/17/16 203 lb 3.2 oz (92.2 kg)      Other studies Reviewed: Additional studies/ records that were reviewed today include: echo. Review of the above records demonstrates:  Please see elsewhere in the note.     ASSESSMENT AND PLAN:  EDEMA: The patient has some mild lower extremity swelling but no evidence of heart failure otherwise.  He had a normal echo in 2017.  I think this is probably some chronic venous stasis  changes and we talked about conservative management of this.  I do not think that further testing is indicated.   CAD: He has mild nonobstructive plaque previously.  No change in therapy.  HTN: Blood pressure is at target.  No change in therapy.  DYSLIPIDEMIA: His LDL last year was excellent at 89.  No change in therapy.  He had a tremendous response to Repatha.  COVID EDUCATION: He has had his vaccine.   Current medicines are reviewed at length with the patient today.  The patient does not have concerns regarding medicines.  The following changes have been made:  no change  Labs/ tests ordered today include: None  Orders Placed This Encounter  Procedures  . EKG 12-Lead     Disposition:   FU with me as needed.Ronnell Guadalajara, MD  01/30/2020 11:30 AM    [place]

## 2020-01-30 ENCOUNTER — Encounter: Payer: Self-pay | Admitting: Cardiology

## 2020-01-30 ENCOUNTER — Other Ambulatory Visit: Payer: Self-pay

## 2020-01-30 ENCOUNTER — Ambulatory Visit (INDEPENDENT_AMBULATORY_CARE_PROVIDER_SITE_OTHER): Payer: PPO | Admitting: Cardiology

## 2020-01-30 VITALS — BP 124/86 | HR 69 | Ht 72.0 in | Wt 213.0 lb

## 2020-01-30 DIAGNOSIS — Z7189 Other specified counseling: Secondary | ICD-10-CM | POA: Diagnosis not present

## 2020-01-30 DIAGNOSIS — I2510 Atherosclerotic heart disease of native coronary artery without angina pectoris: Secondary | ICD-10-CM | POA: Diagnosis not present

## 2020-01-30 DIAGNOSIS — I10 Essential (primary) hypertension: Secondary | ICD-10-CM | POA: Diagnosis not present

## 2020-01-30 DIAGNOSIS — R0602 Shortness of breath: Secondary | ICD-10-CM | POA: Diagnosis not present

## 2020-01-30 NOTE — Patient Instructions (Signed)

## 2020-02-10 DIAGNOSIS — H26493 Other secondary cataract, bilateral: Secondary | ICD-10-CM | POA: Diagnosis not present

## 2020-02-10 DIAGNOSIS — H524 Presbyopia: Secondary | ICD-10-CM | POA: Diagnosis not present

## 2020-02-10 DIAGNOSIS — H02834 Dermatochalasis of left upper eyelid: Secondary | ICD-10-CM | POA: Diagnosis not present

## 2020-02-10 DIAGNOSIS — H02831 Dermatochalasis of right upper eyelid: Secondary | ICD-10-CM | POA: Diagnosis not present

## 2020-03-09 ENCOUNTER — Telehealth: Payer: Self-pay | Admitting: Cardiology

## 2020-03-09 NOTE — Telephone Encounter (Signed)
Pt c/o swelling: STAT is pt has developed SOB within 24 hours  1) How much weight have you gained and in what time span? Not that he knows of. Has gained weight since Covid, not sure if it is from swelling.    2) If swelling, where is the swelling located? ankles  3) Are you currently taking a fluid pill? yes  4) Are you currently SOB? no  5) Do you have a log of your daily weights (if so, list)? no  6) Have you gained 3 pounds in a day or 5 pounds in a week? no  7) Have you traveled recently? no

## 2020-03-09 NOTE — Telephone Encounter (Signed)
Called and spoke to patient. He states that he has had increased swelling in his ankles. Denies SOB. He states that he has gained weight over the past year, but denies significant weight gain. He states that he is taking a fluid pill that his PCP prescribed but is not sure what it is. His list says that he is on maxzide 37.5-25 mg but he is not sure if he is still taking it. He has not been very active. He had a normal echo in 2017. I have instructed the patient to follow up with his PCP. Instructed him to avoid salt and elevate legs and wear compression stockings to assist with swelling. He will let us know if his Sx change or worsen. Patient appreciated the call.

## 2020-03-26 DIAGNOSIS — R972 Elevated prostate specific antigen [PSA]: Secondary | ICD-10-CM | POA: Diagnosis not present

## 2020-03-29 DIAGNOSIS — R3915 Urgency of urination: Secondary | ICD-10-CM | POA: Diagnosis not present

## 2020-03-29 DIAGNOSIS — N5201 Erectile dysfunction due to arterial insufficiency: Secondary | ICD-10-CM | POA: Diagnosis not present

## 2020-03-29 DIAGNOSIS — R972 Elevated prostate specific antigen [PSA]: Secondary | ICD-10-CM | POA: Diagnosis not present

## 2020-04-12 ENCOUNTER — Ambulatory Visit: Payer: PPO | Attending: Internal Medicine

## 2020-04-12 DIAGNOSIS — Z23 Encounter for immunization: Secondary | ICD-10-CM

## 2020-04-12 NOTE — Progress Notes (Signed)
   Covid-19 Vaccination Clinic  Name:  KENNARD FILDES    MRN: [id number] DOB: 18-Jul-1945  04/12/2020  Mr. Punt was observed post Covid-19 immunization for 15 minutes without incident. He was provided with Vaccine Information Sheet and instruction to access the V-Safe system.   Mr. Borgwardt was instructed to call 911 with any severe reactions post vaccine: Marland Kitchen Difficulty breathing  . Swelling of face and throat  . A fast heartbeat  . A bad rash all over body  . Dizziness and weakness

## 2020-04-29 ENCOUNTER — Encounter (INDEPENDENT_AMBULATORY_CARE_PROVIDER_SITE_OTHER): Payer: Self-pay | Admitting: Family Medicine

## 2020-04-29 ENCOUNTER — Other Ambulatory Visit: Payer: Self-pay

## 2020-04-29 ENCOUNTER — Ambulatory Visit (INDEPENDENT_AMBULATORY_CARE_PROVIDER_SITE_OTHER): Payer: PPO | Admitting: Family Medicine

## 2020-04-29 VITALS — BP 121/75 | HR 77 | Temp 98.0°F | Ht 70.0 in | Wt 221.0 lb

## 2020-04-29 DIAGNOSIS — I10 Essential (primary) hypertension: Secondary | ICD-10-CM | POA: Diagnosis not present

## 2020-04-29 DIAGNOSIS — G4733 Obstructive sleep apnea (adult) (pediatric): Secondary | ICD-10-CM

## 2020-04-29 DIAGNOSIS — E782 Mixed hyperlipidemia: Secondary | ICD-10-CM | POA: Diagnosis not present

## 2020-04-29 DIAGNOSIS — E7849 Other hyperlipidemia: Secondary | ICD-10-CM | POA: Diagnosis not present

## 2020-04-29 DIAGNOSIS — Z9989 Dependence on other enabling machines and devices: Secondary | ICD-10-CM | POA: Diagnosis not present

## 2020-04-29 DIAGNOSIS — R0602 Shortness of breath: Secondary | ICD-10-CM | POA: Diagnosis not present

## 2020-04-29 DIAGNOSIS — F39 Unspecified mood [affective] disorder: Secondary | ICD-10-CM | POA: Insufficient documentation

## 2020-04-29 DIAGNOSIS — E669 Obesity, unspecified: Secondary | ICD-10-CM | POA: Diagnosis not present

## 2020-04-29 DIAGNOSIS — Z0289 Encounter for other administrative examinations: Secondary | ICD-10-CM

## 2020-04-29 DIAGNOSIS — Z6831 Body mass index (BMI) 31.0-31.9, adult: Secondary | ICD-10-CM | POA: Diagnosis not present

## 2020-04-29 DIAGNOSIS — R5383 Other fatigue: Secondary | ICD-10-CM | POA: Insufficient documentation

## 2020-04-30 LAB — COMPREHENSIVE METABOLIC PANEL
ALT: 21 IU/L (ref 0–44)
AST: 20 IU/L (ref 0–40)
Albumin/Globulin Ratio: 1.7 (ref 1.2–2.2)
Albumin: 4.2 g/dL (ref 3.7–4.7)
Alkaline Phosphatase: 90 IU/L (ref 44–121)
BUN/Creatinine Ratio: 18 (ref 10–24)
BUN: 22 mg/dL (ref 8–27)
Bilirubin Total: 0.4 mg/dL (ref 0.0–1.2)
CO2: 25 mmol/L (ref 20–29)
Calcium: 9.7 mg/dL (ref 8.6–10.2)
Chloride: 102 mmol/L (ref 96–106)
Creatinine, Ser: 1.22 mg/dL (ref 0.76–1.27)
GFR calc Af Amer: 67 mL/min/{1.73_m2} (ref >59)
GFR calc non Af Amer: 58 mL/min/{1.73_m2} — ABNORMAL LOW (ref >59)
Globulin, Total: 2.5 g/dL (ref 1.5–4.5)
Glucose: 91 mg/dL (ref 65–99)
Potassium: 4.2 mmol/L (ref 3.5–5.2)
Sodium: 141 mmol/L (ref 134–144)
Total Protein: 6.7 g/dL (ref 6.0–8.5)

## 2020-04-30 LAB — CBC WITH DIFFERENTIAL/PLATELET
Basophils Absolute: 0 10*3/uL (ref 0.0–0.2)
Basos: 0 %
EOS (ABSOLUTE): 0.1 10*3/uL (ref 0.0–0.4)
Eos: 1 %
Hematocrit: 43 % (ref 37.5–51.0)
Hemoglobin: 14.6 g/dL (ref 13.0–17.7)
Immature Grans (Abs): 0 10*3/uL (ref 0.0–0.1)
Immature Granulocytes: 0 %
Lymphocytes Absolute: 1.9 10*3/uL (ref 0.7–3.1)
Lymphs: 28 %
MCH: 32.3 pg (ref 26.6–33.0)
MCHC: 34 g/dL (ref 31.5–35.7)
MCV: 95 fL (ref 79–97)
Monocytes Absolute: 0.5 10*3/uL (ref 0.1–0.9)
Monocytes: 7 %
Neutrophils Absolute: 4.4 10*3/uL (ref 1.4–7.0)
Neutrophils: 64 %
Platelets: 246 10*3/uL (ref 150–450)
RBC: 4.52 x10E6/uL (ref 4.14–5.80)
RDW: 13.1 % (ref 11.6–15.4)
WBC: 6.9 10*3/uL (ref 3.4–10.8)

## 2020-04-30 LAB — FOLATE: Folate: 14.4 ng/mL (ref >3.0)

## 2020-04-30 LAB — INSULIN, RANDOM: INSULIN: 8.9 u[IU]/mL (ref 2.6–24.9)

## 2020-04-30 LAB — VITAMIN D 25 HYDROXY (VIT D DEFICIENCY, FRACTURES): Vit D, 25-Hydroxy: 31.5 ng/mL (ref 30.0–100.0)

## 2020-04-30 LAB — LIPID PANEL
Chol/HDL Ratio: 4.2 ratio (ref 0.0–5.0)
Cholesterol, Total: 181 mg/dL (ref 100–199)
HDL: 43 mg/dL (ref >39)
LDL Chol Calc (NIH): 90 mg/dL (ref 0–99)
Triglycerides: 286 mg/dL — ABNORMAL HIGH (ref 0–149)
VLDL Cholesterol Cal: 48 mg/dL — ABNORMAL HIGH (ref 5–40)

## 2020-04-30 LAB — T4, FREE: Free T4: 1.38 ng/dL (ref 0.82–1.77)

## 2020-04-30 LAB — HEMOGLOBIN A1C
Est. average glucose Bld gHb Est-mCnc: 120 mg/dL
Hgb A1c MFr Bld: 5.8 % — ABNORMAL HIGH (ref 4.8–5.6)

## 2020-04-30 LAB — VITAMIN B12: Vitamin B-12: 676 pg/mL (ref 232–1245)

## 2020-04-30 LAB — TSH: TSH: 2.73 u[IU]/mL (ref 0.450–4.500)

## 2020-04-30 LAB — T3: T3, Total: 113 ng/dL (ref 71–180)

## 2020-05-03 NOTE — Progress Notes (Signed)
Chief Complaint:   OBESITY Adrian Skinner (MR# [id number]) is a 74 y.o. male who presents for evaluation and treatment of obesity and related comorbidities. Current BMI is Body mass index is 31.71 kg/m. Adrian Skinner has been struggling with his weight for many years and has been unsuccessful in either losing weight, maintaining weight loss, or reaching his healthy weight goal.  Adrian Skinner is currently in the action stage of change and ready to dedicate time achieving and maintaining a healthier weight. Adrian Skinner is interested in becoming our patient and working on intensive lifestyle modifications including (but not limited to) diet and exercise for weight loss.  Adrian Skinner works full time at [employer]  He lives with his spouse, Adrian Skinner.  He eats out 5 days per week for lunch during work.  He snacks on sweets and carbs.  His PCP is Dr. Shelia Skinner at [facility].   Adrian Skinner's habits were reviewed today and are as follows: His family eats meals together, he thinks his family will eat healthier with him, his desired weight loss is 45 pounds, he started gaining weight 4-5 years ago, his heaviest weight ever was 225 pounds, he is a picky eater and doesn't like to eat healthier foods, he craves sweets and carbs, he is frequently drinking liquids with calories, he frequently makes poor food choices and he struggles with emotional eating.  This is the patient's first visit at [facility].  The patient's NEW PATIENT PACKET that they filled out prior to today's office visit was reviewed at length and information from that paperwork was included within the following office visit note.    Included in the packet: current and past health history, medications, allergies, ROS, gynecologic history (women only), surgical history, family history, social history, weight history, weight loss surgery history (for those that have had weight loss surgery), nutritional evaluation, mood and food  questionnaire along with a depression screening (PHQ9) on all patients, an Epworth questionnaire, sleep habits questionnaire, patient life and health improvement goals questionnaire. These will all be scanned into the patient's chart under Skinner.   During the visit, I independently reviewed the patient's EKG, bioimpedance scale results, and indirect calorimeter results. I used this information to tailor a meal plan for the patient that will help Adrian Skinner to lose weight and will improve his obesity-related conditions going forward.  I performed a medically necessary appropriate examination and/or evaluation. I discussed the assessment and treatment plan with the patient. The patient was provided an opportunity to ask questions and all were answered. The patient agreed with the plan and demonstrated an understanding of the instructions. Labs were ordered today (unless patient declined them) and will be reviewed with the patient at our next visit unless more critical results need to be addressed immediately. Clinical information was updated and documented in the EMR.  Time spent on visit including pre-visit chart review and post-visit care was estimated to be 65 minutes.  A separate 15 minutes was spent on risk counseling (see above/below).   Depression Screen Adrian Skinner Food and Mood (modified PHQ-9) score was 10.  Depression screen PHQ 2/9 04/29/2020  Decreased Interest 2  Down, Depressed, Hopeless 1  PHQ - 2 Score 3  Altered sleeping 2  Tired, decreased energy 2  Change in appetite 1  Feeling bad or failure about yourself  1  Trouble concentrating 0  Moving slowly or fidgety/restless 1  Suicidal thoughts 0  PHQ-9 Score 10  Difficult doing work/chores Not difficult  at all   Assessment/Plan:   1. Other fatigue Adrian Skinner admits to daytime somnolence and reports waking up still tired. [place] has a history of symptoms of daytime fatigue, morning fatigue, morning headache, hypertension and snoring. Adrian Skinner  generally gets 2-4 hours of sleep per night, and states that he has generally restful sleep. Snoring is present. Apneic episodes are present. Epworth Sleepiness Score is 16.  Adrian Skinner does feel that his weight is causing his energy to be lower than it should be. Fatigue may be related to obesity, depression or many other causes. Labs will be ordered, and in the meanwhile, Adrian Skinner will focus on self care including making healthy food choices, increasing physical activity and focusing on stress reduction.  - EKG 12-Lead - Vitamin B12 - CBC with Differential/Platelet - Comprehensive metabolic panel - Folate - Hemoglobin A1c - Insulin, random - Lipid panel - T3 - TSH - VITAMIN D 25 Hydroxy (Vit-D Deficiency, Fractures) - T4, free  2. SOB (shortness of breath) on exertion Adrian Skinner notes increasing shortness of breath with exercising and seems to be worsening over time with weight gain. He notes getting out of breath sooner with activity than he used to. This has gotten worse recently. Adrian Skinner denies shortness of breath at rest or orthopnea.  Adrian Skinner does feel that he gets out of breath more easily that he used to when he exercises. Adrian Skinner's shortness of breath appears to be obesity related and exercise induced. He has agreed to work on weight loss and gradually increase exercise to treat his exercise induced shortness of breath. Will continue to monitor closely.  - Vitamin B12 - CBC with Differential/Platelet - Comprehensive metabolic panel - Folate - Hemoglobin A1c - Insulin, random - Lipid panel - T3 - TSH - VITAMIN D 25 Hydroxy (Vit-D Deficiency, Fractures) - T4, free  3. Other hyperlipidemia Adrian Skinner has hyperlipidemia and has been trying to improve his cholesterol levels with intensive lifestyle modification including a low saturated fat diet, exercise and weight loss. He denies any chest pain, claudication or myalgias.  She is on Repatha through Dr. Percival Spanish.  Plan:  Will check labs today.     Lab Results  Component Value Date   ALT 21 04/29/2020   AST 20 04/29/2020   ALKPHOS 90 04/29/2020   BILITOT 0.4 04/29/2020   Lab Results  Component Value Date   CHOL 181 04/29/2020   HDL 43 04/29/2020   LDLCALC 90 04/29/2020   TRIG 286 (H) 04/29/2020   CHOLHDL 4.2 04/29/2020   - Vitamin B12 - CBC with Differential/Platelet - Comprehensive metabolic panel - Folate - Hemoglobin A1c - Insulin, random - Lipid panel - T3 - TSH - VITAMIN D 25 Hydroxy (Vit-D Deficiency, Fractures) - T4, free  4. OSA on CPAP Adrian Skinner has a diagnosis of sleep apnea. He reports that he is using a CPAP regularly.  Epworth sleepiness score is 16.  He sees Dr. Isidoro Donning of Sleep Medicine.  He sleeps very little and falls asleep around 9 pm and awakens around 1-2 am daily.  He says they have "tried everything" to help him, but nothing helps.  Plan:  OSA treatment and sleep disorder treatment plan per Sleep Medicine provider.  Recommend Calm app nightly.  5. Hypertension, unspecified type Review: taking medications as instructed, no medication side effects noted, no chest pain on exertion, no dyspnea on exertion, no swelling of ankles.  He is taking triamterene-HCTZ.  He was initially started on medication by his PCP for leg swelling.  Plan:  Blood pressure is at goal currently.  Start prudent nutritional plan, weight loss, increase exercise, decrease sat/trans fats.  BP Readings from Last 3 Encounters:  04/29/20 121/75  01/30/20 124/86  05/25/16 122/72   - Vitamin B12 - CBC with Differential/Platelet - Comprehensive metabolic panel - Folate - Hemoglobin A1c - Insulin, random - Lipid panel - T3 - TSH - VITAMIN D 25 Hydroxy (Vit-D Deficiency, Fractures) - T4, free  6. Mood disorder, with emotional eating He is on Zoloft for "stress".  PHQ-9 is 10.  He eats when he is bored.  Denies SI/self-harm and he states he does not feel depressed, just stressed and/or overwhelmed at times.  He says he is a  "people pleaser".  Plan:  Continue medication per PCP.  Check labs.  Will monitor.  - Vitamin B12 - CBC with Differential/Platelet - Comprehensive metabolic panel - Folate - Hemoglobin A1c - Insulin, random - Lipid panel - T3 - TSH - VITAMIN D 25 Hydroxy (Vit-D Deficiency, Fractures) - T4, free  7. Class 1 obesity with serious comorbidity and body mass index (BMI) of 31.0 to 31.9 in adult, unspecified obesity type  [place] is currently in the action stage of change and his goal is to continue with weight loss efforts. I recommend Adrian Skinner begin the structured treatment plan as follows:  He has agreed to the Category 1 Plan.  Exercise goals: All adults should avoid inactivity. Some physical activity is better than none, and adults who participate in any amount of physical activity gain some health benefits.  I recommend he lifts weights 2 days per week (eventually) and get 150 minutes aerobic exercise per week.  Behavioral modification strategies: meal planning and cooking strategies, keeping healthy foods in the home and planning for success.  He was informed of the importance of frequent follow-up visits to maximize his success with intensive lifestyle modifications for his multiple health conditions. He was informed we would discuss his lab results at his next visit unless there is a critical issue that needs to be addressed sooner. Adrian Skinner agreed to keep his next visit at the agreed upon time to discuss these results.  Objective:   Blood pressure 121/75, pulse 77, temperature 98 F (36.7 C), height 5\' 10"  (1.778 m), weight 221 lb (100.2 kg), SpO2 97 %. Body mass index is 31.71 kg/m.  EKG: Normal sinus rhythm, rate 74 bpm.  Indirect Calorimeter completed today shows a VO2 of 238 and a REE of 1649.  His calculated basal metabolic rate is 1914 thus his basal metabolic rate is worse than expected.  General: Cooperative, alert, well developed, in no acute distress. HEENT: Conjunctivae  and lids unremarkable. Cardiovascular: Regular rhythm.  Lungs: Normal work of breathing. Neurologic: No focal deficits.   Lab Results  Component Value Date   CREATININE 1.22 04/29/2020   BUN 22 04/29/2020   NA 141 04/29/2020   K 4.2 04/29/2020   CL 102 04/29/2020   CO2 25 04/29/2020   Lab Results  Component Value Date   ALT 21 04/29/2020   AST 20 04/29/2020   ALKPHOS 90 04/29/2020   BILITOT 0.4 04/29/2020   Lab Results  Component Value Date   HGBA1C 5.8 (H) 04/29/2020   Lab Results  Component Value Date   INSULIN 8.9 04/29/2020   Lab Results  Component Value Date   TSH 2.730 04/29/2020   Lab Results  Component Value Date   CHOL 181 04/29/2020   HDL 43 04/29/2020   LDLCALC 90 04/29/2020  TRIG 286 (H) 04/29/2020   CHOLHDL 4.2 04/29/2020   Lab Results  Component Value Date   WBC 6.9 04/29/2020   HGB 14.6 04/29/2020   HCT 43.0 04/29/2020   MCV 95 04/29/2020   PLT 246 04/29/2020   Obesity Behavioral Intervention:   Approximately 15 minutes were spent on the discussion below.  ASK: We discussed the diagnosis of obesity with Adrian Skinner today and Adrian Skinner agreed to give [place] permission to discuss obesity behavioral modification therapy today.  ASSESS: Adrian Skinner has the diagnosis of obesity and his BMI today is 31.8. Adrian Skinner is in the action stage of change.   ADVISE: Adrian Skinner was educated on the multiple health risks of obesity as well as the benefit of weight loss to improve his health. He was advised of the need for long term treatment and the importance of lifestyle modifications to improve his current health and to decrease his risk of future health problems.  AGREE: Multiple dietary modification options and treatment options were discussed and [place] agreed to follow the recommendations documented in the above note.  ARRANGE: [place] was educated on the importance of frequent visits to treat obesity as outlined per CMS and USPSTF guidelines and agreed to schedule his next  follow up appointment today.  Attestation Statements:   Reviewed by clinician on day of visit: allergies, medications, problem list, medical history, surgical history, family history, social history, and previous encounter notes.  I, Water quality scientist, CMA, am acting as Location manager for [employer], DO.  I have reviewed the above documentation for accuracy and completeness, and I agree with the above. Marjory Sneddon, D.O.  The [place] was signed into law in 2016 which includes the topic of electronic health records.  This provides immediate access to information in MyChart.  This includes consultation notes, operative notes, office notes, lab results and pathology reports.  If you have any questions about what you read please let us know at your next visit so we can discuss your concerns and take corrective action if need be.  We are right here with you.

## 2020-05-13 ENCOUNTER — Ambulatory Visit (INDEPENDENT_AMBULATORY_CARE_PROVIDER_SITE_OTHER): Payer: PPO | Admitting: Family Medicine

## 2020-05-13 ENCOUNTER — Encounter (INDEPENDENT_AMBULATORY_CARE_PROVIDER_SITE_OTHER): Payer: Self-pay | Admitting: Family Medicine

## 2020-05-13 ENCOUNTER — Other Ambulatory Visit: Payer: Self-pay

## 2020-05-13 VITALS — BP 111/68 | HR 67 | Temp 97.9°F | Ht 70.0 in | Wt 217.0 lb

## 2020-05-13 DIAGNOSIS — E7849 Other hyperlipidemia: Secondary | ICD-10-CM

## 2020-05-13 DIAGNOSIS — E860 Dehydration: Secondary | ICD-10-CM | POA: Insufficient documentation

## 2020-05-13 DIAGNOSIS — I10 Essential (primary) hypertension: Secondary | ICD-10-CM | POA: Diagnosis not present

## 2020-05-13 DIAGNOSIS — R7303 Prediabetes: Secondary | ICD-10-CM

## 2020-05-13 DIAGNOSIS — E559 Vitamin D deficiency, unspecified: Secondary | ICD-10-CM | POA: Insufficient documentation

## 2020-05-13 DIAGNOSIS — M1712 Unilateral primary osteoarthritis, left knee: Secondary | ICD-10-CM | POA: Diagnosis not present

## 2020-05-13 DIAGNOSIS — Z6833 Body mass index (BMI) 33.0-33.9, adult: Secondary | ICD-10-CM | POA: Diagnosis not present

## 2020-05-13 DIAGNOSIS — E669 Obesity, unspecified: Secondary | ICD-10-CM

## 2020-05-13 MED ORDER — VITAMIN D (ERGOCALCIFEROL) 1.25 MG (50000 UNIT) PO CAPS: 50000.0000 [IU] | ORAL_CAPSULE | ORAL | 0 refills | Status: DC

## 2020-05-18 DIAGNOSIS — H00015 Hordeolum externum left lower eyelid: Secondary | ICD-10-CM | POA: Diagnosis not present

## 2020-05-18 NOTE — Progress Notes (Signed)
Chief Complaint:   OBESITY Adrian Skinner is here to discuss his progress with his obesity treatment plan along with follow-up of his obesity related diagnoses. Adrian Skinner is on the Category 1 Plan and states he is following his eating plan approximately 90% of the time. 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  Today's visit was #: 2 Starting weight: 221 lbs Starting date: 04/29/2020 Today's weight: 217 lbs Today's date: 05/13/2020 Total lbs lost to date: 4 lbs Total lbs lost since last in-office visit: 4 lbs Total weight loss percentage to date: -1.81%  Interim History:  Adrian Skinner is here today to review his NEW Meal Plan and to discuss all recent labs done here and/ or done at outside facilities.  Extended time was spent counseling Adrian Skinner on all new disease processes that were discovered or that are worsening.    Overall, he is satisfied.  His hunger is controlled unless he is bored at work.  If that happens, he may experience some desire for food.  Plan:  Reviewed all labs with him in full.  Handouts given on Thanksgiving Day strategies.   Assessment/Plan:   1. Essential hypertension Discussed labs with patient today.   Adrian Skinner is taking Maxzide-25 37.5-25 mg once daily.   Review: taking medications as instructed, no medication side effects noted, no chest pain on exertion, no dyspnea on exertion, no swelling of ankles.    Plan:  Blood pressure is at goal.  Continue medications per PCP.  Will closely monitor as he loses weight.  BP Readings from Last 3 Encounters:  05/13/20 111/68  04/29/20 121/75  01/30/20 124/86    2. Other hyperlipidemia Discussed labs with patient today.   Adrian Skinner is currently on [place] per Cardiology.   Triglycerides are elevated.  He has hyperlipidemia and has been trying to improve his cholesterol levels with intensive lifestyle modification including a low saturated fat diet, exercise and weight loss.  He denies any chest pain, claudication or myalgias.  Plan:  Continue medication management per Cardiology.  Education done regarding how to lower triglycerides.  Lab Results  Component Value Date   ALT 21 04/29/2020   AST 20 04/29/2020   ALKPHOS 90 04/29/2020   BILITOT 0.4 04/29/2020   Lab Results  Component Value Date   CHOL 181 04/29/2020   HDL 43 04/29/2020   LDLCALC 90 04/29/2020   TRIG 286 (H) 04/29/2020   CHOLHDL 4.2 04/29/2020    3. Prediabetes New.  Discussed labs with patient today.  Adrian Skinner has a diagnosis of prediabetes based on his elevated HgA1c and was informed this puts him at greater risk of developing diabetes. He continues to work on diet and exercise to decrease his risk of diabetes. He denies nausea or hypoglycemia.  Plan:  - I reiterated and again counseled patient on pathophysiology of the disease process of I.R. and/or Pre-DM.    - Stressed importance of dietary and lifestyle modifications resulting in weight loss as first line txmnt - in addition we discussed the risks and benefits of metformin and various other medication options which can help [place] in the management of this disease process. Will hold off for now  - continue to decrease simple carbs; increase fiber and proteins -> follow meal plan  - handouts provided at pt's request after education provided.  All concerns/questions addressed.   - anticipatory guidance given.  Recheck A1c and fasting insulin level in approximately  3 months from last check or as deemed fit.   Lab Results  Component Value Date   HGBA1C 5.8 (H) 04/29/2020   Lab Results  Component Value Date   INSULIN 8.9 04/29/2020     4. Mild dehydration New.  Discussed labs with patient today.   Plan:  - Increase water intake to 80 ounces per day, if possible. - We discussed the signs and symptoms of dehydration; some of which may include muscle cramping, constipation or even orthostatic symptoms.    - he was encouraged to adequately  hydrate and monitor fluid status to avoid dehydration as well as weight loss plateaus.   - Unless pre-existing renal or cardiopulmonary conditions exist which patient was told to limit their fluid intake by another provider, I recommended roughly one half of their weight in pounds, to be the approximate ounces of non-caloric, non-caffeinated beverages they should drink per day; including more if they are engaging in exercise.    5. Vitamin D deficiency New.  Discussed labs with patient today.   Adrian Skinner's Vitamin D level was 31.5 on 04/29/2020. He is currently taking no vitamin D supplement. He denies nausea, vomiting or muscle weakness.   Plan: - Discussed importance of vitamin D to their health and well-being.  - possible symptoms of low Vitamin D can be low energy, depressed mood, muscle aches, joint aches, osteoporosis etc. - low Vitamin D levels may be linked to an increased risk of cardiovascular events and even increased risk of cancers- such as colon and breast.  - I recommend pt take a weekly prescription vit D - see script below   - this may be a lifelong thing, and we will need to monitor levels regularly to keep within normal limits.  - weight loss will likely improve availability of vitamin D, thus encouraged Adrian Skinner to continue with meal plan and their weight loss efforts to further improve this condition  -Start Vitamin D, Ergocalciferol, (DRISDOL) 1.25 MG (50000 UNIT) CAPS capsule; Take 1 capsule (50,000 Units total) by mouth every 7 (seven) days.  Dispense: 4 capsule; Refill: 0   6. Class 1 obesity with serious comorbidity and body mass index (BMI) of 33.0 to 33.9 in adult, unspecified obesity type  Adrian Skinner is currently in the action stage of change. As such, his goal is to continue with weight loss efforts. He has agreed to the Category 2 Plan.   Exercise goals: As is.  Behavioral modification strategies: increasing lean protein intake, decreasing simple carbohydrates, no  skipping meals, meal planning and cooking strategies, keeping healthy foods in the home, holiday eating strategies , celebration eating strategies and planning for success.  Adrian Skinner has agreed to follow-up with our clinic in 2 weeks. He was informed of the importance of frequent follow-up visits to maximize his success with intensive lifestyle modifications for his multiple health conditions.    Objective:   Blood pressure 111/68, pulse 67, temperature 97.9 F (36.6 C), height 5\' 10"  (1.778 m), weight 217 lb (98.4 kg), SpO2 97 %. Body mass index is 31.14 kg/m.  General: Cooperative, alert, well developed, in no acute distress. HEENT: Conjunctivae and lids unremarkable. Cardiovascular: Regular rhythm.  Lungs: Normal work of breathing. Neurologic: No focal deficits.   Lab Results  Component Value Date   CREATININE 1.22 04/29/2020   BUN 22 04/29/2020   NA 141 04/29/2020   K 4.2 04/29/2020   CL 102 04/29/2020   CO2 25 04/29/2020   Lab Results  Component Value Date  ALT 21 04/29/2020   AST 20 04/29/2020   ALKPHOS 90 04/29/2020   BILITOT 0.4 04/29/2020   Lab Results  Component Value Date   HGBA1C 5.8 (H) 04/29/2020   Lab Results  Component Value Date   INSULIN 8.9 04/29/2020   Lab Results  Component Value Date   TSH 2.730 04/29/2020   Lab Results  Component Value Date   CHOL 181 04/29/2020   HDL 43 04/29/2020   LDLCALC 90 04/29/2020   TRIG 286 (H) 04/29/2020   CHOLHDL 4.2 04/29/2020   Lab Results  Component Value Date   WBC 6.9 04/29/2020   HGB 14.6 04/29/2020   HCT 43.0 04/29/2020   MCV 95 04/29/2020   PLT 246 04/29/2020   Obesity Behavioral Intervention:   Approximately 15 minutes were spent on the discussion below.  ASK: We discussed the diagnosis of obesity with Naveed today and Ciro agreed to give [place] permission to discuss obesity behavioral modification therapy today.  ASSESS: Robbie has the diagnosis of obesity and his BMI today is 31.1. Tremel is in  the action stage of change.   ADVISE: Constantin was educated on the multiple health risks of obesity as well as the benefit of weight loss to improve his health. He was advised of the need for long term treatment and the importance of lifestyle modifications to improve his current health and to decrease his risk of future health problems.  AGREE: Multiple dietary modification options and treatment options were discussed and Cedarius agreed to follow the recommendations documented in the above note.  ARRANGE: Jago was educated on the importance of frequent visits to treat obesity as outlined per CMS and USPSTF guidelines and agreed to schedule his next follow up appointment today.  Attestation Statements:   Reviewed by clinician on day of visit: allergies, medications, problem list, medical history, surgical history, family history, social history, and previous encounter notes.  I, Water quality scientist, CMA, am acting as Location manager for [employer], DO.  I have reviewed the above documentation for accuracy and completeness, and I agree with the above. Marjory Sneddon, D.O.  The [place] was signed into law in 2016 which includes the topic of electronic health records.  This provides immediate access to information in MyChart.  This includes consultation notes, operative notes, office notes, lab results and pathology reports.  If you have any questions about what you read please let us know at your next visit so we can discuss your concerns and take corrective action if need be.  We are right here with you.

## 2020-05-27 ENCOUNTER — Other Ambulatory Visit: Payer: Self-pay

## 2020-05-27 ENCOUNTER — Ambulatory Visit (INDEPENDENT_AMBULATORY_CARE_PROVIDER_SITE_OTHER): Payer: PPO | Admitting: Family Medicine

## 2020-05-27 ENCOUNTER — Encounter (INDEPENDENT_AMBULATORY_CARE_PROVIDER_SITE_OTHER): Payer: Self-pay | Admitting: Family Medicine

## 2020-05-27 VITALS — BP 131/84 | HR 79 | Temp 97.7°F | Ht 70.0 in | Wt 217.0 lb

## 2020-05-27 DIAGNOSIS — Z6831 Body mass index (BMI) 31.0-31.9, adult: Secondary | ICD-10-CM

## 2020-05-27 DIAGNOSIS — E559 Vitamin D deficiency, unspecified: Secondary | ICD-10-CM

## 2020-05-27 DIAGNOSIS — E669 Obesity, unspecified: Secondary | ICD-10-CM

## 2020-05-27 DIAGNOSIS — I10 Essential (primary) hypertension: Secondary | ICD-10-CM

## 2020-05-27 MED ORDER — VITAMIN D (ERGOCALCIFEROL) 1.25 MG (50000 UNIT) PO CAPS: 50000.0000 [IU] | ORAL_CAPSULE | ORAL | 0 refills | Status: DC

## 2020-05-31 NOTE — Progress Notes (Signed)
Chief Complaint:   OBESITY Adrian Skinner is here to discuss his progress with his obesity treatment plan along with follow-up of his obesity related diagnoses. Adrian Skinner is on the Category 2 Plan and states he is following his eating plan approximately 75% of the time. Adrian Skinner states he is walking (5 days), golfing (1 day), yard work (2 days) 60 minutes 5 times per week.  Today's visit was #: 3 Starting weight: 221 lbs Starting date: 04/29/2020 Today's weight: 217 lbs Today's date: 05/27/2020 Total lbs lost to date: 4 lbs Total lbs lost since last in-office visit: 0 lbs Total weight loss percentage to date: -1.81%  Interim History: Adrian Skinner reports that he ate what he wanted to on Thanksgiving and is shocked he didn't gain weight. He is still walking 5 days a week. Adrian Skinner states that he misses variety with breakfast, chocolate, and peanut butter.   Plan: I discussed Adrian Skinner's chocolate, PB2, keto cups, etc. with patient.  Assessment/Plan:   1. Essential hypertension Adrian Skinner is prescribed Maxzide 37.5-25 mg. He is taking medications as instructed, no medication side effects noted, no chest pain on exertion, no dyspnea on exertion, no swelling of ankles.   BP Readings from Last 3 Encounters:  05/27/20 131/84  05/13/20 111/68  04/29/20 121/75   Plan: Blood pressure is at goal. Continue current treatment plan with Maxzide.   2. Vitamin D deficiency Adrian Skinner's Vitamin D level was 31.5 on 04/29/2020. He is currently taking prescription vitamin D 50,000 IU each week. He denies nausea, vomiting or muscle weakness.  Plan: Continue current treatment plan. Refill Vit D for 1 month, as per below.  Refill- Vitamin D, Ergocalciferol, (DRISDOL) 1.25 MG (50000 UNIT) CAPS capsule; Take 1 capsule (50,000 Units total) by mouth every 7 (seven) days.  Dispense: 4 capsule; Refill: 0  3. Class 1 obesity with serious comorbidity and body mass index (BMI) of 31.0 to 31.9 in adult, unspecified obesity type Adrian Skinner is currently  in the action stage of change. As such, his goal is to continue with weight loss efforts. He has agreed to the Category 2 Plan with breakfast options.   Exercise goals: As is but add HIIT training and 2 days of strength training per week.  Behavioral modification strategies: meal planning and cooking strategies and planning for success.  Adrian Skinner has agreed to follow-up with our clinic in 2 weeks. He was informed of the importance of frequent follow-up visits to maximize his success with intensive lifestyle modifications for his multiple health conditions.   Objective:   Blood pressure 131/84, pulse 79, temperature 97.7 F (36.5 C), height 5\' 10"  (1.778 m), weight 217 lb (98.4 kg), SpO2 97 %. Body mass index is 31.14 kg/m.  General: Cooperative, alert, well developed, in no acute distress. HEENT: Conjunctivae and lids unremarkable. Cardiovascular: Regular rhythm.  Lungs: Normal work of breathing. Neurologic: No focal deficits.   Lab Results  Component Value Date   CREATININE 1.22 04/29/2020   BUN 22 04/29/2020   NA 141 04/29/2020   K 4.2 04/29/2020   CL 102 04/29/2020   CO2 25 04/29/2020   Lab Results  Component Value Date   ALT 21 04/29/2020   AST 20 04/29/2020   ALKPHOS 90 04/29/2020   BILITOT 0.4 04/29/2020   Lab Results  Component Value Date   HGBA1C 5.8 (H) 04/29/2020   Lab Results  Component Value Date   INSULIN 8.9 04/29/2020   Lab Results  Component Value Date   TSH 2.730 04/29/2020  Lab Results  Component Value Date   CHOL 181 04/29/2020   HDL 43 04/29/2020   LDLCALC 90 04/29/2020   TRIG 286 (H) 04/29/2020   CHOLHDL 4.2 04/29/2020   Lab Results  Component Value Date   WBC 6.9 04/29/2020   HGB 14.6 04/29/2020   HCT 43.0 04/29/2020   MCV 95 04/29/2020   PLT 246 04/29/2020   No results found for: IRON, TIBC, FERRITIN  Obesity Behavioral Intervention:   Approximately 15 minutes were spent on the discussion below.  ASK: We discussed the  diagnosis of obesity with Adrian Skinner today and Adrian Skinner agreed to give [place] permission to discuss obesity behavioral modification therapy today.  ASSESS: Adrian Skinner has the diagnosis of obesity and his BMI today is 31.2. Adrian Skinner is in the action stage of change.   ADVISE: Adrian Skinner was educated on the multiple health risks of obesity as well as the benefit of weight loss to improve his health. He was advised of the need for long term treatment and the importance of lifestyle modifications to improve his current health and to decrease his risk of future health problems.  AGREE: Multiple dietary modification options and treatment options were discussed and Adrian Skinner agreed to follow the recommendations documented in the above note.  ARRANGE: Adrian Skinner was educated on the importance of frequent visits to treat obesity as outlined per CMS and USPSTF guidelines and agreed to schedule his next follow up appointment today.  Attestation Statements:   Reviewed by clinician on day of visit: allergies, medications, problem list, medical history, surgical history, family history, social history, and previous encounter notes.  Coral Ceo, am acting as Location manager for [employer], DO.  I have reviewed the above documentation for accuracy and completeness, and I agree with the above. Marjory Sneddon, D.O.  The [place] was signed into law in 2016 which includes the topic of electronic health records.  This provides immediate access to information in MyChart.  This includes consultation notes, operative notes, office notes, lab results and pathology reports.  If you have any questions about what you read please let us know at your next visit so we can discuss your concerns and take corrective action if need be.  We are right here with you.

## 2020-06-14 ENCOUNTER — Ambulatory Visit (INDEPENDENT_AMBULATORY_CARE_PROVIDER_SITE_OTHER): Payer: PPO | Admitting: Family Medicine

## 2020-07-07 DIAGNOSIS — J320 Chronic maxillary sinusitis: Secondary | ICD-10-CM | POA: Diagnosis not present

## 2020-07-07 DIAGNOSIS — G44209 Tension-type headache, unspecified, not intractable: Secondary | ICD-10-CM | POA: Diagnosis not present

## 2020-07-19 DIAGNOSIS — D2272 Melanocytic nevi of left lower limb, including hip: Secondary | ICD-10-CM | POA: Diagnosis not present

## 2020-07-19 DIAGNOSIS — L821 Other seborrheic keratosis: Secondary | ICD-10-CM | POA: Diagnosis not present

## 2020-07-19 DIAGNOSIS — D225 Melanocytic nevi of trunk: Secondary | ICD-10-CM | POA: Diagnosis not present

## 2020-07-19 DIAGNOSIS — D2261 Melanocytic nevi of right upper limb, including shoulder: Secondary | ICD-10-CM | POA: Diagnosis not present

## 2020-07-19 DIAGNOSIS — D485 Neoplasm of uncertain behavior of skin: Secondary | ICD-10-CM | POA: Diagnosis not present

## 2020-07-19 DIAGNOSIS — D2271 Melanocytic nevi of right lower limb, including hip: Secondary | ICD-10-CM | POA: Diagnosis not present

## 2020-07-19 DIAGNOSIS — D2262 Melanocytic nevi of left upper limb, including shoulder: Secondary | ICD-10-CM | POA: Diagnosis not present

## 2020-08-11 DIAGNOSIS — G4733 Obstructive sleep apnea (adult) (pediatric): Secondary | ICD-10-CM | POA: Diagnosis not present

## 2020-08-12 ENCOUNTER — Telehealth: Payer: Self-pay | Admitting: Cardiology

## 2020-08-12 NOTE — Telephone Encounter (Signed)
Pt c/o BP issue: STAT if pt c/o blurred vision, one-sided weakness or slurred speech  1. What are your last 5 BP readings?  08/11/20: 161/101 08/12/20: 125/85, 107/85 2. Are you having any other symptoms (ex. Dizziness, headache, blurred vision, passed out)? Headaches   3. What is your BP issue? PT said diastolic # is high (usually mid 70s)

## 2020-08-12 NOTE — Telephone Encounter (Signed)
Spoke with patient. Patient reports he has been having headaches recently and when he went to the sleep doctor yesterday it was 162/101. He woke up with a headache today and took a break from work to go to [employer] and check his blood pressure. It was 125/85 and 107/85. No other symptoms. Only lifestyle change he has made is being on a diet called [employer]avia which contains a lot of soy but decreased sugar. Reviewed medication list. Patient only takes Maxzide as needed for leg swelling. No other blood pressure medications.   Advised patient to obtain blood pressure cuff and monitor blood pressure twice a day for a week. Also made an appointment with Dr. Percival Spanish on 08/27/2020 for follow up. Informed patient if blood pressure is consistently reading high to go ahead and call back. Will route to Dr. Percival Spanish.

## 2020-08-13 NOTE — Telephone Encounter (Signed)
Totally agree.  He needs to present with a BP diary so I know what to adjust.  Thanks.

## 2020-08-20 NOTE — Telephone Encounter (Signed)
Returned call to patient who states that he was calling in to give blood pressure readings. Patient states his average blood pressure was 140/81. Patient concerned as he states his blood pressure is usually 120/80's. Patient states he has been doing the [employer] and is unsure if this is related to his increase in blood pressure or not. Advised patient to monitor sodium intake, and to monitor blood pressure daily and bring log with daily blood pressures to his appointment with Dr. Percival Spanish next week scheduled 3/4. Advised patient to also bring blood pressure cuff so that we can correlate his blood pressure cuff with ours in the office. Advised patient to call back with any issues, questions, or concerns in the mean time. Advised patient I would forward message to Dr. Percival Spanish. Patient verbalized understanding.

## 2020-08-20 NOTE — Telephone Encounter (Signed)
Follow Up:   Pt is calling back with his blood pressure readings. He said he was just going to do the averages, I told him I would be glad to take readings. He just gave the average, he said they were all about the same. His average was 140/81.

## 2020-08-23 NOTE — Telephone Encounter (Signed)
Agree 

## 2020-08-27 ENCOUNTER — Ambulatory Visit: Payer: PPO | Admitting: Cardiology

## 2020-09-08 DIAGNOSIS — S39012A Strain of muscle, fascia and tendon of lower back, initial encounter: Secondary | ICD-10-CM | POA: Diagnosis not present

## 2020-09-14 DIAGNOSIS — S39012D Strain of muscle, fascia and tendon of lower back, subsequent encounter: Secondary | ICD-10-CM | POA: Diagnosis not present

## 2020-09-14 DIAGNOSIS — M6281 Muscle weakness (generalized): Secondary | ICD-10-CM | POA: Diagnosis not present

## 2020-09-22 ENCOUNTER — Encounter: Payer: Self-pay | Admitting: *Deleted

## 2020-09-22 ENCOUNTER — Other Ambulatory Visit: Payer: Self-pay | Admitting: *Deleted

## 2020-09-24 ENCOUNTER — Encounter: Payer: Self-pay | Admitting: Diagnostic Neuroimaging

## 2020-09-24 ENCOUNTER — Ambulatory Visit: Payer: PPO | Admitting: Diagnostic Neuroimaging

## 2020-09-27 DIAGNOSIS — I10 Essential (primary) hypertension: Secondary | ICD-10-CM | POA: Diagnosis not present

## 2020-09-27 DIAGNOSIS — E7800 Pure hypercholesterolemia, unspecified: Secondary | ICD-10-CM | POA: Diagnosis not present

## 2020-09-27 DIAGNOSIS — Z125 Encounter for screening for malignant neoplasm of prostate: Secondary | ICD-10-CM | POA: Diagnosis not present

## 2020-10-04 DIAGNOSIS — Z0001 Encounter for general adult medical examination with abnormal findings: Secondary | ICD-10-CM | POA: Diagnosis not present

## 2020-10-04 DIAGNOSIS — N529 Male erectile dysfunction, unspecified: Secondary | ICD-10-CM | POA: Diagnosis not present

## 2020-10-04 DIAGNOSIS — M6208 Separation of muscle (nontraumatic), other site: Secondary | ICD-10-CM | POA: Diagnosis not present

## 2020-10-04 DIAGNOSIS — G4733 Obstructive sleep apnea (adult) (pediatric): Secondary | ICD-10-CM | POA: Diagnosis not present

## 2020-10-04 DIAGNOSIS — G2581 Restless legs syndrome: Secondary | ICD-10-CM | POA: Diagnosis not present

## 2020-10-04 DIAGNOSIS — M25473 Effusion, unspecified ankle: Secondary | ICD-10-CM | POA: Diagnosis not present

## 2020-10-04 DIAGNOSIS — Z6832 Body mass index (BMI) 32.0-32.9, adult: Secondary | ICD-10-CM | POA: Diagnosis not present

## 2020-10-04 DIAGNOSIS — I10 Essential (primary) hypertension: Secondary | ICD-10-CM | POA: Diagnosis not present

## 2020-10-04 DIAGNOSIS — F411 Generalized anxiety disorder: Secondary | ICD-10-CM | POA: Diagnosis not present

## 2020-10-04 DIAGNOSIS — D171 Benign lipomatous neoplasm of skin and subcutaneous tissue of trunk: Secondary | ICD-10-CM | POA: Diagnosis not present

## 2020-10-04 DIAGNOSIS — I2510 Atherosclerotic heart disease of native coronary artery without angina pectoris: Secondary | ICD-10-CM | POA: Diagnosis not present

## 2020-10-04 DIAGNOSIS — J309 Allergic rhinitis, unspecified: Secondary | ICD-10-CM | POA: Diagnosis not present

## 2020-10-12 DIAGNOSIS — M4125 Other idiopathic scoliosis, thoracolumbar region: Secondary | ICD-10-CM | POA: Diagnosis not present

## 2020-10-15 DIAGNOSIS — M4125 Other idiopathic scoliosis, thoracolumbar region: Secondary | ICD-10-CM | POA: Diagnosis not present

## 2020-10-19 DIAGNOSIS — M4125 Other idiopathic scoliosis, thoracolumbar region: Secondary | ICD-10-CM | POA: Diagnosis not present

## 2020-10-19 DIAGNOSIS — M545 Low back pain, unspecified: Secondary | ICD-10-CM | POA: Diagnosis not present

## 2020-10-22 ENCOUNTER — Telehealth: Payer: Self-pay | Admitting: Cardiology

## 2020-10-22 NOTE — Telephone Encounter (Signed)
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

## 2020-10-22 NOTE — Telephone Encounter (Signed)
Received call from patient.He stated has been sob,swelling in both feet for the past 2 to 3 weeks.Patient refused a extender appointment.Appointment scheduled with Dr.Hochrein 11/05/20 at 2:40 pm.Advised if sob worsens go to ED.

## 2020-10-22 NOTE — Telephone Encounter (Signed)
Per patient schedule message:  "SHORTNESS OF BREATH, STILL HAVING SWELLING IN ANKLES-NOT URGENT BUT FEEL BETTER IF I SEE HIM TO CONFIRM"  I scheduled the patient for 11/09/20 with Dr. Percival Spanish and informed him that I'm forwarding a message to clinical staff regarding his symptoms.

## 2020-10-27 DIAGNOSIS — Z86010 Personal history of colonic polyps: Secondary | ICD-10-CM | POA: Diagnosis not present

## 2020-10-27 DIAGNOSIS — K219 Gastro-esophageal reflux disease without esophagitis: Secondary | ICD-10-CM | POA: Diagnosis not present

## 2020-10-29 DIAGNOSIS — M4125 Other idiopathic scoliosis, thoracolumbar region: Secondary | ICD-10-CM | POA: Diagnosis not present

## 2020-11-04 DIAGNOSIS — M7989 Other specified soft tissue disorders: Secondary | ICD-10-CM | POA: Insufficient documentation

## 2020-11-04 DIAGNOSIS — E785 Hyperlipidemia, unspecified: Secondary | ICD-10-CM | POA: Insufficient documentation

## 2020-11-04 NOTE — Progress Notes (Deleted)
Cardiology Office Note   Date:  11/04/2020   ID:  Adrian Skinner, DOB 1945/07/07, MRN [id number]  PCP:  Deland Pretty, MD  Cardiologist:   Minus Breeding, MD   No chief complaint on file.     History of Present Illness: Adrian Skinner is a 75 y.o. male who presents for follow up of chest pain.  I saw the patient on 11/06/2013 with chest pain. His symptom was concerning for obstructive coronary artery disease. Stress test obtained on 11/07/2013 was intermediate risk with relatively small area of mild inferoseptal ischemia. He underwent cardiac catheterization on 11/11/2013 which showed EF 55-65%, 10-20% mid LAD lesion, no other significant coronary artery disease.  I have not seen him since 2017.     He presents because of lower extremity swelling and SOB.  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  He does not do as much walking as he used to.  He does still do a little walking for exercise and golfs.  He notices that he has lower extremity swelling at the end of the day it goes away when he wakes up in the morning.  However, he is not having any cardiovascular symptoms otherwise.  The patient denies any new symptoms such as chest discomfort, neck or arm discomfort. There has been no new shortness of breath, PND or orthopnea. There have been no reported palpitations, presyncope or syncope.      Past Medical History:  Diagnosis Date  . Anemia   . Anxiety   . Back pain   . Cataract   . Dizziness   . Environmental and seasonal allergies   . Essential hypertension   . GERD (gastroesophageal reflux disease)   . Headache   . Heartburn   . Hiatal hernia   . Hyperlipidemia   . Insomnia   . Migraine without status migrainosus, not intractable   . OSA (obstructive sleep apnea) 11/25/2014  . Reflux   . RLS (restless legs syndrome)   . Sleep apnea   . SOBOE (shortness of breath on exertion)   . Swelling of  both lower extremities   . Vertigo     Past Surgical History:  Procedure Laterality Date  . LEFT HEART CATHETERIZATION WITH CORONARY ANGIOGRAM N/A 11/11/2013   Procedure: LEFT HEART CATHETERIZATION WITH CORONARY ANGIOGRAM;  Surgeon: Peter M Martinique, MD;  Location: [facility] CATH LAB;  Service: Cardiovascular;  Laterality: N/A;  . ROTATOR CUFF REPAIR Right    right  . TONSILLECTOMY AND ADENOIDECTOMY     Family History  Adopted: Yes  Family history unknown: Yes   Social History   Socioeconomic History  . Marital status: Married    Spouse name: Adrian Skinner  . Number of children: 2  . Years of education: Bachelors  . Highest education level: Not on file  Occupational History  . Occupation: Retired  . Occupation: Scientist, [employer] [place]  Tobacco Use  . Smoking status: Never Smoker  . Smokeless tobacco: Never Used  Substance and Sexual Activity  . Alcohol use: Yes    Alcohol/week: 0.0 standard drinks    Comment: glass of wine (2-4oz) daily  . Drug use: No  . Sexual activity: Not on file  Other Topics Concern  . Not on file  Social History Narrative   Retired.  Part time driver for [employer].  Lives at home with  wife.       PT IS Adrian Skinner.      Right-handed.      2 cups caffeine per day.   Social Determinants of Health   Financial Resource Strain: Not on file  Food Insecurity: Not on file  Transportation Needs: Not on file  Physical Activity: Not on file  Stress: Not on file  Social Connections: Not on file     Current Outpatient Medications  Medication Sig Dispense Refill  . Azelastine HCl 137 MCG/SPRAY SOLN Place into the nose.    . Evolocumab (REPATHA SURECLICK) 381 MG/ML SOAJ     . omeprazole (PRILOSEC) 40 MG capsule Take by mouth.    . pramipexole (MIRAPEX) 0.5 MG tablet Take by mouth. Take 2 at bedtime    . sertraline (ZOLOFT) 25 MG tablet Take 25 mg by mouth daily.    . tamsulosin (FLOMAX) 0.4 MG CAPS capsule     .  triamterene-hydrochlorothiazide (MAXZIDE-25) 37.5-25 MG tablet     . Vitamin D, Ergocalciferol, (DRISDOL) 1.25 MG (50000 UNIT) CAPS capsule Take 1 capsule (50,000 Units total) by mouth every 7 (seven) days. 4 capsule 0   No current facility-administered medications for this visit.    Allergies:   Patient has no known allergies.    ROS:  Please see the history of present illness.   Otherwise, review of systems are positive for none.   All other systems are reviewed and negative.    PHYSICAL EXAM: VS:  There were no vitals taken for this visit. , BMI There is no height or weight on file to calculate BMI. GENERAL:  Well appearing HEENT:  Pupils equal round and reactive, fundi not visualized, oral mucosa unremarkable NECK:  No jugular venous distention, waveform within normal limits, carotid upstroke brisk and symmetric, no bruits, no thyromegaly LYMPHATICS:  No cervical, inguinal adenopathy LUNGS:  Clear to auscultation bilaterally BACK:  No CVA tenderness CHEST:  Unremarkable HEART:  PMI not displaced or sustained,S1 and S2 within normal limits, no S3, no S4, no clicks, no rubs, no murmurs ABD:  Flat, positive bowel sounds normal in frequency in pitch, no bruits, no rebound, no guarding, no midline pulsatile mass, no hepatomegaly, no splenomegaly EXT:  2 plus pulses throughout, trace edema, no cyanosis no clubbing SKIN:  No rashes no nodules NEURO:  Cranial nerves II through XII grossly intact, motor grossly intact throughout PSYCH:  Cognitively intact, oriented to person place and time    EKG:  EKG is not ordered today.    Recent Labs: 04/29/2020: ALT 21; BUN 22; Creatinine, Ser 1.22; Hemoglobin 14.6; Platelets 246; Potassium 4.2; Sodium 141; TSH 2.730    Lipid Panel    Component Value Date/Time   CHOL 181 04/29/2020 1055   TRIG 286 (H) 04/29/2020 1055   HDL 43 04/29/2020 1055   CHOLHDL 4.2 04/29/2020 1055   LDLCALC 90 04/29/2020 1055      Wt Readings from Last 3  Encounters:  05/27/20 217 lb (98.4 kg)  05/13/20 217 lb (98.4 kg)  04/29/20 221 lb (100.2 kg)      Other studies Reviewed: Additional studies/ records that were reviewed today include: echo. Review of the above records demonstrates:  Please see elsewhere in the note.     ASSESSMENT AND PLAN:  EDEMA: ***  The patient has some mild lower extremity swelling but no evidence of heart failure otherwise.  He had a normal echo in 2017.  I think this is probably  some chronic venous stasis changes and we talked about conservative management of this.  I do not think that further testing is indicated.   SOB:  ***   CAD:   ***  He has mild nonobstructive plaque previously.  No change in therapy.  HTN: Blood pressure is *** at target.  No change in therapy.  DYSLIPIDEMIA: His LDL last year was *** excellent at 54.  No change in therapy.  He had a tremendous response to Repatha.   Current medicines are reviewed at length with the patient today.  The patient does not have concerns regarding medicines.  The following changes have been made:  no change  Labs/ tests ordered today include: None  No orders of the defined types were placed in this encounter.    Disposition:   FU with me as needed.Ronnell Guadalajara, MD  11/04/2020 9:12 PM    [facility]

## 2020-11-05 ENCOUNTER — Ambulatory Visit: Payer: PPO | Admitting: Cardiology

## 2020-11-05 DIAGNOSIS — M7989 Other specified soft tissue disorders: Secondary | ICD-10-CM

## 2020-11-05 DIAGNOSIS — E785 Hyperlipidemia, unspecified: Secondary | ICD-10-CM

## 2020-11-05 DIAGNOSIS — I10 Essential (primary) hypertension: Secondary | ICD-10-CM

## 2020-11-05 DIAGNOSIS — R0602 Shortness of breath: Secondary | ICD-10-CM

## 2020-11-05 DIAGNOSIS — I2510 Atherosclerotic heart disease of native coronary artery without angina pectoris: Secondary | ICD-10-CM

## 2020-11-05 DIAGNOSIS — M4125 Other idiopathic scoliosis, thoracolumbar region: Secondary | ICD-10-CM | POA: Diagnosis not present

## 2020-11-09 ENCOUNTER — Telehealth: Payer: Self-pay | Admitting: Hematology and Oncology

## 2020-11-09 ENCOUNTER — Ambulatory Visit: Payer: PPO | Admitting: Cardiology

## 2020-11-09 NOTE — Telephone Encounter (Signed)
Received a new hem referral from Dr. Kathlene November for anemia. Mr. Lunz returned my call and has been scheduled to see Dr. Chryl Heck on 5/25 at 3pm. Pt aware to arrive 20 minutes early.

## 2020-11-11 DIAGNOSIS — M545 Low back pain, unspecified: Secondary | ICD-10-CM | POA: Diagnosis not present

## 2020-11-17 ENCOUNTER — Encounter: Payer: PPO | Admitting: Hematology and Oncology

## 2020-11-17 ENCOUNTER — Other Ambulatory Visit: Payer: PPO

## 2020-11-17 DIAGNOSIS — M4125 Other idiopathic scoliosis, thoracolumbar region: Secondary | ICD-10-CM | POA: Diagnosis not present

## 2020-11-19 DIAGNOSIS — M545 Low back pain, unspecified: Secondary | ICD-10-CM | POA: Diagnosis not present

## 2020-11-29 DIAGNOSIS — U071 COVID-19: Secondary | ICD-10-CM | POA: Diagnosis not present

## 2020-12-07 DIAGNOSIS — M4125 Other idiopathic scoliosis, thoracolumbar region: Secondary | ICD-10-CM | POA: Diagnosis not present

## 2020-12-13 DIAGNOSIS — S39012A Strain of muscle, fascia and tendon of lower back, initial encounter: Secondary | ICD-10-CM | POA: Diagnosis not present

## 2020-12-23 DIAGNOSIS — J320 Chronic maxillary sinusitis: Secondary | ICD-10-CM | POA: Diagnosis not present

## 2020-12-30 DIAGNOSIS — Z86010 Personal history of colonic polyps: Secondary | ICD-10-CM | POA: Diagnosis not present

## 2020-12-30 DIAGNOSIS — K640 First degree hemorrhoids: Secondary | ICD-10-CM | POA: Diagnosis not present

## 2020-12-30 DIAGNOSIS — D122 Benign neoplasm of ascending colon: Secondary | ICD-10-CM | POA: Diagnosis not present

## 2020-12-30 DIAGNOSIS — D125 Benign neoplasm of sigmoid colon: Secondary | ICD-10-CM | POA: Diagnosis not present

## 2021-01-04 DIAGNOSIS — D122 Benign neoplasm of ascending colon: Secondary | ICD-10-CM | POA: Diagnosis not present

## 2021-01-04 DIAGNOSIS — D125 Benign neoplasm of sigmoid colon: Secondary | ICD-10-CM | POA: Diagnosis not present

## 2021-01-10 DIAGNOSIS — M545 Low back pain, unspecified: Secondary | ICD-10-CM | POA: Diagnosis not present

## 2021-01-13 ENCOUNTER — Institutional Professional Consult (permissible substitution): Payer: PPO | Admitting: Plastic Surgery

## 2021-02-07 NOTE — Telephone Encounter (Signed)
Spoke to patient he stated he has been having sob and chest tightness for the past 2 months.Stated he had another episode of sob and chest tightness this morning while walking.He is having swelling in both ankles.No sob and chest tightness at present.Appointment scheduled with Dr.Hochrein 8/18 at 9:30 am.

## 2021-02-09 ENCOUNTER — Encounter: Payer: Self-pay | Admitting: Cardiology

## 2021-02-09 NOTE — Progress Notes (Signed)
Cardiology Office Note   Date:  02/10/2021   ID:  Adrian Skinner, DOB 08/23/1945, MRN [id number]  PCP:  Deland Pretty, MD  Cardiologist:   Minus Breeding, MD    Chief Complaint  Patient presents with   Edema      History of Present Illness: Adrian Skinner is a 75 y.o. male who presents for follow up of chest pain.  I saw the patient on 11/06/2013 with chest pain. His symptom was concerning for obstructive coronary artery disease. Stress test obtained on 11/07/2013 was intermediate risk with relatively small area of mild inferoseptal ischemia. He underwent cardiac catheterization on 11/11/2013 which showed EF 55-65%, 10-20% mid LAD lesion, no other significant coronary artery disease.    He called recently because he was having chest pain and increased leg swelling.   I did see him in August of last year when he had some mild edema but no further work-up was suggested.  He called to be added to the schedule today because he was having some increased swelling.  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  He has gained about 23 pounds in the last year because he has not been exercising.  He has a new job where his feet are on the ground a lot.  He has no swelling.  The swelling does not go away at night when he puts his feet up.  However, it comes back relatively quickly when he is up on his feet or sitting.  He has had some new shortness of breath.  He is a little more winded with activities but this is sporadic.  He is not describing PND or orthopnea.  He is not having any new palpitations, presyncope or syncope.  He does have some twinges of chest discomfort but nothing that stops him from his activities   Past Medical History:  Diagnosis Date   Anemia    Anxiety    Back pain    Cataract    Environmental and seasonal allergies    Essential hypertension    GERD (gastroesophageal reflux disease)    Headache    Hiatal hernia    Hyperlipidemia    Insomnia    Migraine without status  migrainosus, not intractable    OSA (obstructive sleep apnea) 11/25/2014   Reflux    RLS (restless legs syndrome)    Sleep apnea    Vertigo     Past Surgical History:  Procedure Laterality Date   LEFT HEART CATHETERIZATION WITH CORONARY ANGIOGRAM N/A 11/11/2013   Procedure: LEFT HEART CATHETERIZATION WITH CORONARY ANGIOGRAM;  Surgeon: Peter M Martinique, MD;  Location: [facility] CATH LAB;  Service: Cardiovascular;  Laterality: N/A;   ROTATOR CUFF REPAIR Right    right   TONSILLECTOMY AND ADENOIDECTOMY     Current Outpatient Medications  Medication Sig Dispense Refill   Azelastine HCl 137 MCG/SPRAY SOLN Place into the nose.     Evolocumab (REPATHA SURECLICK) [id number] MG/ML SOAJ      omeprazole (PRILOSEC) 40 MG capsule Take by mouth.     pramipexole (MIRAPEX) 0.5 MG tablet Take by mouth. Take 2 at bedtime     sertraline (ZOLOFT) 25 MG tablet Take 25 mg by mouth daily.     tamsulosin (FLOMAX) 0.4 MG CAPS capsule      triamterene-hydrochlorothiazide (MAXZIDE-25) 37.5-25 MG tablet      Vitamin D, Ergocalciferol, (DRISDOL) 1.25 MG (50000 UNIT) CAPS capsule Take 1 capsule (50,000 Units total) by mouth every 7 (seven) days. (  Patient not taking: Reported on 02/10/2021) 4 capsule 0   No current facility-administered medications for this visit.    Allergies:   Patient has no known allergies.    ROS:  Please see the history of present illness.   Otherwise, review of systems are positive for none.   All other systems are reviewed and negative.    PHYSICAL EXAM: VS:  BP 130/82   Pulse (!) 58   Ht '5\' 11"'$  (1.803 m)   Wt 230 lb 12.8 oz (104.7 kg)   SpO2 98%   BMI 32.19 kg/m  , BMI Body mass index is 32.19 kg/m. GENERAL:  Well appearing NECK:  No jugular venous distention, waveform within normal limits, carotid upstroke brisk and symmetric, no bruits, no thyromegaly LUNGS:  Clear to auscultation bilaterally CHEST:  Unremarkable HEART:  PMI not displaced or sustained,S1 and S2 within normal limits, no  S3, no S4, no clicks, no rubs, no murmurs ABD:  Flat, positive bowel sounds normal in frequency in pitch, no bruits, no rebound, no guarding, no midline pulsatile mass, no hepatomegaly, no splenomegaly EXT:  2 plus pulses throughout, moderate bilateral lower extremity edema, no cyanosis no clubbing   EKG:  EKG is  ordered today. Normal sinus rhythm, rate 58, axis within normal limits, intervals within normal limits, no acute ST-T wave changes.   Recent Labs: 04/29/2020: ALT 21; BUN 22; Creatinine, Ser 1.22; Hemoglobin 14.6; Platelets 246; Potassium 4.2; Sodium 141; TSH 2.730    Lipid Panel    Component Value Date/Time   CHOL 181 04/29/2020 1055   TRIG 286 (H) 04/29/2020 1055   HDL 43 04/29/2020 1055   CHOLHDL 4.2 04/29/2020 1055   LDLCALC 90 04/29/2020 1055      Wt Readings from Last 3 Encounters:  02/10/21 230 lb 12.8 oz (104.7 kg)  05/27/20 217 lb (98.4 kg)  05/13/20 217 lb (98.4 kg)      Other studies Reviewed: Additional studies/ records that were reviewed today include: Labs. Review of the above records demonstrates:  Please see elsewhere in the note.     ASSESSMENT AND PLAN:  EDEMA: This is probably still venous insufficiency.  However, I will check a BNP level and an echocardiogram.  We talked about conservative therapies.  He does wear some compression stockings.  Recent talked about increased physical activity with walking in particular and we talked about keeping his feet elevated.  If his work-up as above is negative he will try these conservative therapies and let me know if he is edema or shortness of breath gets worse.   CAD:   I do not suspect an anginal equivalent.  He had minimal coronary plaque several years ago on cath.  No further work-up is suggested.  If his shortness of breath does get worse he might suggest a treadmill test.  However, he did have previous false positive stress testing.   HTN: Blood pressure is at target.  No change in therapy.    DYSLIPIDEMIA: His LDL lwas 75 earlier this year with an HDL of 42.  No change in therapy.   OBESITY: Lower carbohydrate diet and he would like consultation with nutritionist and I will arrange this.  Current medicines are reviewed at length with the patient today.  The patient does not have concerns regarding medicines.  The following changes have been made:  None  Labs/ tests ordered today include:   Orders Placed This Encounter  Procedures   Pro b natriuretic peptide (BNP)[id number])  Referral to Nutrition and Diabetes Services   EKG 12-Lead   ECHOCARDIOGRAM COMPLETE      Disposition:   FU with APP in 3 - 4 months.     Signed, Minus Breeding, MD  02/10/2021 10:23 AM    [facility]

## 2021-02-10 ENCOUNTER — Encounter: Payer: Self-pay | Admitting: Cardiology

## 2021-02-10 ENCOUNTER — Other Ambulatory Visit: Payer: Self-pay

## 2021-02-10 ENCOUNTER — Ambulatory Visit: Payer: PPO | Admitting: Cardiology

## 2021-02-10 VITALS — BP 130/82 | HR 58 | Ht 71.0 in | Wt 230.8 lb

## 2021-02-10 DIAGNOSIS — R072 Precordial pain: Secondary | ICD-10-CM | POA: Diagnosis not present

## 2021-02-10 DIAGNOSIS — M7989 Other specified soft tissue disorders: Secondary | ICD-10-CM | POA: Diagnosis not present

## 2021-02-10 DIAGNOSIS — E669 Obesity, unspecified: Secondary | ICD-10-CM

## 2021-02-10 DIAGNOSIS — E785 Hyperlipidemia, unspecified: Secondary | ICD-10-CM

## 2021-02-10 DIAGNOSIS — R0602 Shortness of breath: Secondary | ICD-10-CM | POA: Diagnosis not present

## 2021-02-10 DIAGNOSIS — I10 Essential (primary) hypertension: Secondary | ICD-10-CM | POA: Diagnosis not present

## 2021-02-10 NOTE — Patient Instructions (Signed)
  Lab Work:  Your physician recommends that you  HAVE LAB WORK TODAY  If you have labs (blood work) drawn today and your tests are completely normal, you will receive your results only by: MyChart Message (if you have MyChart) OR A paper copy in the mail If you have any lab test that is abnormal or we need to change your treatment, we will call you to review the results.   Testing/Procedures:  Your physician has requested that you have an echocardiogram. Echocardiography is a painless test that uses sound waves to create images of your heart. It provides your doctor with information about the size and shape of your heart and how well your heart's chambers and valves are working. This procedure takes approximately one hour. There are no restrictions for this procedure. [place]   Follow-Up: At [facility], you and your health needs are our priority.  As part of our continuing mission to provide you with exceptional heart care, we have created designated Provider Care Teams.  These Care Teams include your primary Cardiologist (physician) and Advanced Practice Providers (APPs -  Physician Assistants and Nurse Practitioners) who all work together to provide you with the care you need, when you need it.  We recommend signing up for the patient portal called "MyChart".  Sign up information is provided on this After Visit Summary.  MyChart is used to connect with patients for Virtual Visits (Telemedicine).  Patients are able to view lab/test results, encounter notes, upcoming appointments, etc.  Non-urgent messages can be sent to your provider as well.   To learn more about what you can do with MyChart, go to [email or web address].    Your next appointment:   3 month(s)  The format for your next appointment:   In Person  Provider:   You will see one of the following Advanced Practice Providers on your designated Care Team:   Rosaria Ferries, PA-C Caron Presume,  PA-C Jory Sims, DNP, ANP

## 2021-02-11 LAB — PRO B NATRIURETIC PEPTIDE: NT-Pro BNP: 52 pg/mL (ref 0–486)

## 2021-02-16 ENCOUNTER — Other Ambulatory Visit: Payer: Self-pay

## 2021-02-16 ENCOUNTER — Ambulatory Visit (HOSPITAL_COMMUNITY): Payer: PPO | Attending: Cardiovascular Disease

## 2021-02-16 DIAGNOSIS — R0602 Shortness of breath: Secondary | ICD-10-CM | POA: Insufficient documentation

## 2021-02-16 LAB — ECHOCARDIOGRAM COMPLETE
Area-P 1/2: 3.31 cm2
Calc EF: 60.3 %
MV M vel: 5.18 m/s
MV Peak grad: 107.3 mmHg
S' Lateral: 3.2 cm
Single Plane A2C EF: 54.5 %
Single Plane A4C EF: 65 %

## 2021-02-22 DIAGNOSIS — Z961 Presence of intraocular lens: Secondary | ICD-10-CM | POA: Diagnosis not present

## 2021-02-22 DIAGNOSIS — H524 Presbyopia: Secondary | ICD-10-CM | POA: Diagnosis not present

## 2021-02-24 DIAGNOSIS — M5416 Radiculopathy, lumbar region: Secondary | ICD-10-CM | POA: Diagnosis not present

## 2021-02-24 MED ORDER — FUROSEMIDE 20 MG PO TABS: 20.0000 mg | ORAL_TABLET | Freq: Every day | ORAL | 3 refills | Status: DC | PRN

## 2021-02-24 NOTE — Telephone Encounter (Signed)
Called pt and made him aware of MD's recommendations. Pt verbalized understanding and new orders placed.   Minus Breeding, MD  You Yesterday (3:32 PM)   From my note     "We talked about conservative therapies.  He does wear some compression stockings.  Recent talked about increased physical activity with walking in particular and we talked about keeping his feet elevated.  If his work-up as above is negative he will try these conservative therapies and let me know if he is edema or shortness of breath gets worse. "     In addition, I would be happy to give him 20 mg of Lasix and he could take this for 3 or 4 days and then stop and see how he responds.  We could give him enough to take PRN as well.  Lasix 20 mg PO qd PRN.  Disp 30 with 3 refills.

## 2021-03-07 DIAGNOSIS — M545 Low back pain, unspecified: Secondary | ICD-10-CM | POA: Diagnosis not present

## 2021-03-17 DIAGNOSIS — M25461 Effusion, right knee: Secondary | ICD-10-CM | POA: Diagnosis not present

## 2021-03-17 DIAGNOSIS — M25561 Pain in right knee: Secondary | ICD-10-CM | POA: Diagnosis not present

## 2021-04-19 DIAGNOSIS — D225 Melanocytic nevi of trunk: Secondary | ICD-10-CM | POA: Diagnosis not present

## 2021-04-19 DIAGNOSIS — D171 Benign lipomatous neoplasm of skin and subcutaneous tissue of trunk: Secondary | ICD-10-CM | POA: Diagnosis not present

## 2021-04-19 DIAGNOSIS — L570 Actinic keratosis: Secondary | ICD-10-CM | POA: Diagnosis not present

## 2021-04-19 DIAGNOSIS — L821 Other seborrheic keratosis: Secondary | ICD-10-CM | POA: Diagnosis not present

## 2021-05-12 ENCOUNTER — Ambulatory Visit (HOSPITAL_BASED_OUTPATIENT_CLINIC_OR_DEPARTMENT_OTHER): Payer: PPO | Admitting: Physical Therapy

## 2021-05-13 ENCOUNTER — Ambulatory Visit: Payer: PPO | Admitting: Adult Health

## 2021-05-27 ENCOUNTER — Ambulatory Visit (HOSPITAL_BASED_OUTPATIENT_CLINIC_OR_DEPARTMENT_OTHER): Payer: PPO | Attending: Orthopedic Surgery | Admitting: Physical Therapy

## 2021-05-27 ENCOUNTER — Other Ambulatory Visit: Payer: Self-pay

## 2021-05-27 ENCOUNTER — Encounter (HOSPITAL_BASED_OUTPATIENT_CLINIC_OR_DEPARTMENT_OTHER): Payer: Self-pay | Admitting: Physical Therapy

## 2021-05-27 DIAGNOSIS — R262 Difficulty in walking, not elsewhere classified: Secondary | ICD-10-CM | POA: Insufficient documentation

## 2021-05-27 DIAGNOSIS — M6281 Muscle weakness (generalized): Secondary | ICD-10-CM | POA: Insufficient documentation

## 2021-05-27 DIAGNOSIS — M545 Low back pain, unspecified: Secondary | ICD-10-CM | POA: Insufficient documentation

## 2021-05-27 NOTE — Therapy (Signed)
OUTPATIENT PHYSICAL THERAPY THORACOLUMBAR EVALUATION   Patient Name: Adrian Skinner MRN: [id number] DOB:1945/09/30, 75 y.o., male Today's Date: 05/27/2021   PT End of Session - 05/27/21 1108     Visit Number 1    Number of Visits 17    Date for PT Re-Evaluation 08/25/21    Authorization Type HTA    PT Start Time 1100    PT Stop Time 1145    PT Time Calculation (min) 45 min    Activity Tolerance Patient tolerated treatment well    Behavior During Therapy WFL for tasks assessed/performed             Past Medical History:  Diagnosis Date   Anemia    Anxiety    Back pain    Cataract    Environmental and seasonal allergies    Essential hypertension    GERD (gastroesophageal reflux disease)    Headache    Hiatal hernia    Hyperlipidemia    Insomnia    Migraine without status migrainosus, not intractable    OSA (obstructive sleep apnea) 11/25/2014   Reflux    RLS (restless legs syndrome)    Sleep apnea    Vertigo    Past Surgical History:  Procedure Laterality Date   LEFT HEART CATHETERIZATION WITH CORONARY ANGIOGRAM N/A 11/11/2013   Procedure: LEFT HEART CATHETERIZATION WITH CORONARY ANGIOGRAM;  Surgeon: Peter M Martinique, MD;  Location: [facility] CATH LAB;  Service: Cardiovascular;  Laterality: N/A;   ROTATOR CUFF REPAIR Right    right   TONSILLECTOMY AND ADENOIDECTOMY     Patient Active Problem List   Diagnosis Date Noted   Leg swelling 11/04/2020   Dyslipidemia 11/04/2020   Prediabetes 05/13/2020   Mild dehydration 05/13/2020   Vitamin D deficiency 05/13/2020   SOBOE (shortness of breath on exertion) 04/29/2020   Other fatigue 04/29/2020   Other hyperlipidemia 04/29/2020   Hypertension 04/29/2020   Mood disorder ([place])- EE 04/29/2020   Educated about COVID-19 virus infection 01/29/2020   Essential hypertension 01/29/2020   Coronary artery disease involving native coronary artery of native heart without angina pectoris 01/29/2020   SOB (shortness of breath)  01/29/2020   OSA (obstructive sleep apnea) 11/25/2014   Advanced sleep phase syndrome 11/25/2014   Chest pain 11/06/2013    PCP: Deland Pretty, MD  REFERRING PROVIDER: Melina Schools, MD  REFERRING DIAG: M54.16 (ICD-10-CM) - Radiculopathy, lumbar region   THERAPY DIAG:  Pain, lumbar region  Muscle weakness (generalized)  Difficulty walking  ONSET DATE:  07/2020  SUBJECTIVE:  SUBJECTIVE STATEMENT: Pt reports this is a chronic back issues. 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 Pt locates the pain to the middle and lower back of the back. MRI reports stenosis and arthritis in the wainstline area. Pt states he had an epidural that did nothing. The muscle feels like it constantly feels like its in a spasm. Pt states he does have some NT and pain coming down the legs. He states pain does not go below the knee. Stays in the thighs.  In Feb, he had a flare up of pain while playing golf. Pt states it happened with full swings. Pt states he cannot rotate now and tried to play 9 holes yesterday but causes more pain today. Pt currently does not have pain but feels stiff. He states he tried to do beginners pilates but it "kills me." The frustration is that the improvement does not last. Pt has tried the "Stretch Zone" which made no difference. Pt states previous PT has given him flexion and extension movements.    PERTINENT HISTORY:  Rectus diastis- 20 years  PAIN:  Are you having pain? No  Pain description: intermittent sharp, stabbing Aggravating factors: doing dishes, rotational movement, slight fwd Relieving factors: supported position, TENS,  ice/heat, massage  PRECAUTIONS: None  WEIGHT BEARING RESTRICTIONS No  FALLS:  Has patient fallen in last 6 months? No  LIVING ENVIRONMENT: Lives with: lives with their family and lives with their spouse Lives in: House/apartment Has following equipment at home: None  OCCUPATION: Pt still works for [employer]. Sits most of the day. Works 40 hrs/week.   PLOF: Independent  PATIENT GOALS : Pt would like to return to golfing, daily life, and exercise pain free.    OBJECTIVE:   DIAGNOSTIC FINDINGS:  N/A in medical chart. Pt reports imaging performed.  PATIENT SURVEYS:  FOTO 95 57 at D/C 5pts MCII  SCREENING FOR RED FLAGS: Bowel or bladder incontinence: No Spinal tumors: No Cauda equina syndrome: No Compression fracture: No   COGNITION:  Overall cognitive status: Within functional limits for tasks assessed     SENSATION:  Light touch: Appears intact  POSTURE:  Decreased lumbar lordosis  LUMBARAROM/PROM  A/PROM A/PROM  05/27/2021  Flexion 75%  Extension 25% p!  Right lateral flexion WFL  Left lateral flexion WFL  Right rotation 75%P!  Left rotation 75% P!   (Blank rows = not tested)  LE AROM/PROM:  A/PROM Right 05/27/2021 Left 05/27/2021  Hip flexion 110 110  Hip extension 0 0  Hip abduction    Hip adduction    Hip internal rotation 15 20 p!  Hip external rotation 40 40   (Blank rows = not tested)  LE MMT:  MMT Right 05/27/2021 Left 05/27/2021  Hip flexion 4+/5 4+/5  Hip extension 4+/5 4+/5  Hip abduction 4+/5 4+/5  Hip adduction 4+/5 4+/5  Hip internal rotation    Hip external rotation 4+/5 4+/5  Knee flexion 5/5 5/5  Knee extension 5/5 5/5   (Blank rows = not tested)  LUMBAR SPECIAL TESTS:  Straight leg raise test: Negative, Slump test: Negative, SI Compression/distraction test: Negative, and Trendelenburg sign: Positive  SPINAL SEGMENTAL MOBILITY ASSESSMENT:  Significant segmental stiffness T10-L5 with UPA and CPA Bilateral T/S and L/S  paraspinal hypertonicity and TTP  FUNCTIONAL TESTS:  5 times sit to  stand: UE required   GAIT: Distance walked: 24ft Assistive device utilized: None Level of assistance: Complete Independence Comments: R trunk lean, decreased step length Reciprocal stepping with stairs, no UE support, decreased eccentric lowering control    TODAY'S TREATMENT   Joint mob: bilat L1-5 CPA and bilat UPA grade III-IV  Exercises Seated Pelvic Tilt - 5-6 x daily - 7 x weekly - 2 sets - 10 reps Seated Quadratus Lumborum Stretch in Chair - 2 x daily - 7 x weekly - 1 sets - 3 reps - 30 hold Sidelying Open Book - 2 x daily - 7 x weekly - 2 sets - 10 reps - 5 hold   PATIENT EDUCATION:  Education details: MOI, diagnosis, prognosis, anatomy, exercise progression, DOMS expectations, muscle firing,  envelope of function, HEP, POC  Person educated: Patient Education method: Explanation, Demonstration, Tactile cues, Verbal cues, and Handouts Education comprehension: verbalized understanding, returned demonstration, verbal cues required, and tactile cues required   HOME EXERCISE PROGRAM: Access Code: [email or web address] URL: [email or web address] Date: 05/27/2021 Prepared by: Daleen Bo   ASSESSMENT:  CLINICAL IMPRESSION: Patient is a 76 y.o. male who was seen today for physical therapy evaluation and treatment for cc of LBP. Pt's s/s appear consistent with mechanical LBP due to joint stiffness and lumbar soft tissue restrictions. Pt's occupational demands as well as reported history scoliosis are also likely contributing factors. Pt has a flexion preference with extension intolerance. Clinical testing did not recreate radiating LE pain.  Objective impairments include Abnormal gait, decreased activity tolerance, decreased balance, decreased endurance, decreased mobility, difficulty walking, decreased ROM, decreased strength, hypomobility, increased fascial restrictions, increased muscle spasms, impaired  flexibility, improper body mechanics, postural dysfunction, and pain. These impairments are limiting patient from cleaning, community activity, driving, occupation, yard work, and [employer] . Personal factors including Age, Behavior pattern, Fitness, Time since onset of injury/illness/exacerbation, and 1-2 comorbidities:    are also affecting patient's functional outcome. Patient will benefit from skilled PT to address above impairments and improve overall function.  REHAB POTENTIAL: Good  CLINICAL DECISION MAKING: Stable/uncomplicated  EVALUATION COMPLEXITY: Low   GOALS: SHORT TERM GOALS:  STG Name Target Date Goal status  1 Pt will become independent with HEP in order to demonstrate synthesis of PT education.  :  06/10/2021 INITIAL  2 Pt will be able to demonstrate pain free L/S ROM in order to demonstrate functional improvement in lumbar function for self-care and house hold duties.  :  07/08/2021 INITIAL  3 Pt will score at least 5 pt increase on FOTO to demonstrate functional improvement in MCII and pt perceived function.   : 07/08/2021 INITIAL  4 Pt will be able to demonstrate/report ability to sit/stand/sleep for extended periods of time without pain in order to demonstrate functional improvement and tolerance to static positioning.  : 07/08/2021 INITIAL   LONG TERM GOALS:   LTG Name Target Date Goal status  1 Pt  will become independent with final HEP in order to demonstrate synthesis of PT education.  : 08/19/2021 INITIAL  2 Pt will be able to demonstrate ability to perform golf swing simulated motions without pain in order to demonstrate functional improvement in lumbar function for self-care and house hold duties.  : 08/19/2021 INITIAL  3 Pt will be able to perform 5XSTS in under 12s  in order to demonstrate functional improvement above the cut off score for adults.  : 08/19/2021 INITIAL  4 Pt will score >/= 57 on FOTO to demonstrate functional improvement in  LBP.  : 08/19/2021 INITIAL   PLAN: PT FREQUENCY: 1-2x/week  PT DURATION: 12 weeks (likely D/C by 8)  PLANNED INTERVENTIONS: Therapeutic exercises, Therapeutic activity, Neuro Muscular re-education, Balance training, Gait training, Patient/Family education, Joint mobilization, Stair training, Prosthetic training, Aquatic Therapy, Dry Needling, Electrical stimulation, Spinal mobilization, Cryotherapy, Moist heat, scar mobilization, Taping, Vasopneumatic device, Traction, Ultrasound, Ionotophoresis 4mg /ml Dexamethasone, and Manual therapy  PLAN FOR NEXT SESSION: review HEP, joint mobilizations, segmental flexion, cat/cow  Daleen Bo PT, DPT 05/27/21 9:48 PM

## 2021-06-08 ENCOUNTER — Ambulatory Visit (HOSPITAL_BASED_OUTPATIENT_CLINIC_OR_DEPARTMENT_OTHER): Payer: Self-pay | Admitting: Physical Therapy

## 2021-06-14 ENCOUNTER — Other Ambulatory Visit: Payer: Self-pay

## 2021-06-14 ENCOUNTER — Ambulatory Visit (HOSPITAL_BASED_OUTPATIENT_CLINIC_OR_DEPARTMENT_OTHER): Payer: PPO | Admitting: Physical Therapy

## 2021-06-14 ENCOUNTER — Encounter (HOSPITAL_BASED_OUTPATIENT_CLINIC_OR_DEPARTMENT_OTHER): Payer: Self-pay | Admitting: Physical Therapy

## 2021-06-14 DIAGNOSIS — M6281 Muscle weakness (generalized): Secondary | ICD-10-CM

## 2021-06-14 DIAGNOSIS — M545 Low back pain, unspecified: Secondary | ICD-10-CM | POA: Diagnosis not present

## 2021-06-14 DIAGNOSIS — R262 Difficulty in walking, not elsewhere classified: Secondary | ICD-10-CM

## 2021-06-14 NOTE — Therapy (Signed)
OUTPATIENT PHYSICAL THERAPY TREATMENT   Patient Name: Adrian Skinner MRN: [id number] DOB:11-14-1945, 75 y.o., male Today's Date: 06/14/2021   PT End of Session - 06/14/21 1253     Visit Number 2    Number of Visits 17    Date for PT Re-Evaluation 08/25/21    Authorization Type HTA    PT Start Time 1300    PT Stop Time 1340    PT Time Calculation (min) 40 min    Activity Tolerance Patient tolerated treatment well    Behavior During Therapy WFL for tasks assessed/performed             Past Medical History:  Diagnosis Date   Anemia    Anxiety    Back pain    Cataract    Environmental and seasonal allergies    Essential hypertension    GERD (gastroesophageal reflux disease)    Headache    Hiatal hernia    Hyperlipidemia    Insomnia    Migraine without status migrainosus, not intractable    OSA (obstructive sleep apnea) 11/25/2014   Reflux    RLS (restless legs syndrome)    Sleep apnea    Vertigo    Past Surgical History:  Procedure Laterality Date   LEFT HEART CATHETERIZATION WITH CORONARY ANGIOGRAM N/A 11/11/2013   Procedure: LEFT HEART CATHETERIZATION WITH CORONARY ANGIOGRAM;  Surgeon: Peter M Martinique, MD;  Location: [facility] CATH LAB;  Service: Cardiovascular;  Laterality: N/A;   ROTATOR CUFF REPAIR Right    right   TONSILLECTOMY AND ADENOIDECTOMY     Patient Active Problem List   Diagnosis Date Noted   Leg swelling 11/04/2020   Dyslipidemia 11/04/2020   Prediabetes 05/13/2020   Mild dehydration 05/13/2020   Vitamin D deficiency 05/13/2020   SOBOE (shortness of breath on exertion) 04/29/2020   Other fatigue 04/29/2020   Other hyperlipidemia 04/29/2020   Hypertension 04/29/2020   Mood disorder ([place])- EE 04/29/2020   Educated about COVID-19 virus infection 01/29/2020   Essential hypertension 01/29/2020   Coronary artery disease involving native coronary artery of native heart without angina pectoris 01/29/2020   SOB (shortness of breath) 01/29/2020   OSA  (obstructive sleep apnea) 11/25/2014   Advanced sleep phase syndrome 11/25/2014   Chest pain 11/06/2013    PCP: Deland Pretty, MD  REFERRING PROVIDER: Deland Pretty, MD  REFERRING DIAG: M54.16 (ICD-10-CM) - Radiculopathy, lumbar region   THERAPY DIAG:  Pain, lumbar region  Muscle weakness (generalized)  Difficulty walking  ONSET DATE:  07/2020  SUBJECTIVE:  SUBJECTIVE STATEMENT: Pt states he is still having pain and stiffness. He had signficant relief after manual therapy last session and was able to play 16 holes of golf without pain.    PERTINENT HISTORY:  Rectus diastis- 20 years  PAIN:  Are you having pain? Yes 5/10 (stiff/tightness)  Pain description: intermittent sharp, stabbing Aggravating factors: doing dishes, rotational movement, slight fwd Relieving factors: supported position, TENS, ice/heat, massage  PRECAUTIONS: None  OCCUPATION: Pt still works for [employer]. Sits most of the day. Works 40 hrs/week.   PLOF: Independent  PATIENT GOALS : Pt would like to return to golfing, daily life, and exercise pain free.    OBJECTIVE:    PATIENT SURVEYS:  FOTO 52 57 at D/C 5pts MCII   TODAY'S TREATMENT   Joint mob: bilat L1-5 CPA and bilat UPA grade III-IV  Exercises Child's pose 3s 10x Standing segment Seated Pelvic Tilt - 5-6 x daily - 7 x weekly - 2 sets - 10 reps Seated Quadratus Lumborum Stretch in Chair - 2 x daily - 7 x weekly - 1 sets - 3 reps - 30 hold   PATIENT EDUCATION:  Education details:  exercise progression, DOMS expectations, muscle firing,  envelope of function, HEP, POC  Person educated: Patient Education method: Explanation, Demonstration, Tactile cues, Verbal cues, and Handouts Education comprehension: verbalized understanding, returned  demonstration, verbal cues required, and tactile cues required   HOME EXERCISE PROGRAM: Access Code: [email or web address] URL: [email or web address] Date: 05/27/2021 Prepared by: Daleen Bo   ASSESSMENT:  CLINICAL IMPRESSION: Pt with improvement in report of stiffness and tightness into lumbar spine following manual therapy and exercise. Report of 5/10 to 2/10. Pt able to incorporate general lumbar mobility exercise without significant increase in pain as well as introduce standing segmental flexion. Pt did have "pinching" pain with lumbar extension today. Pt required VC and TC for lumbopelvic dissociation during exercise as well as for technique and form with HEP. Plan to continue with STM/joint mob and mobility movements. Introduce abdominal and lumbar strength at next session.   Objective impairments include Abnormal gait, decreased activity tolerance, decreased balance, decreased endurance, decreased mobility, difficulty walking, decreased ROM, decreased strength, hypomobility, increased fascial restrictions, increased muscle spasms, impaired flexibility, improper body mechanics, postural dysfunction, and pain. These impairments are limiting patient from cleaning, community activity, driving, occupation, yard work, and [employer] . Personal factors including Age, Behavior pattern, Fitness, Time since onset of injury/illness/exacerbation, and 1-2 comorbidities:    are also affecting patient's functional outcome. Patient will benefit from skilled PT to address above impairments and improve overall function.  REHAB POTENTIAL: Good  CLINICAL DECISION MAKING: Stable/uncomplicated  EVALUATION COMPLEXITY: Low   GOALS: SHORT TERM GOALS:  STG Name Target Date Goal status  1 Pt will become independent with HEP in order to demonstrate synthesis of PT education.  :  06/28/2021 INITIAL  2 Pt will be able to demonstrate pain free L/S ROM in order to demonstrate functional improvement in  lumbar function for self-care and house hold duties.  :  07/26/2021 INITIAL  3 Pt will score at least 5 pt increase on FOTO to demonstrate functional improvement in MCII and pt perceived function.   : 07/26/2021 INITIAL  4 Pt will be able to demonstrate/report ability to sit/stand/sleep for extended periods of time without pain in order to demonstrate functional improvement and tolerance to static positioning.  : 07/26/2021 INITIAL   LONG TERM GOALS:   LTG Name Target Date Goal status  1 Pt  will become  independent with final HEP in order to demonstrate synthesis of PT education.  : 09/06/2021 INITIAL  2 Pt will be able to demonstrate ability to perform golf swing simulated motions without pain in order to demonstrate functional improvement in lumbar function for self-care and house hold duties.  : 09/06/2021 INITIAL  3 Pt will be able to perform 5XSTS in under 12s  in order to demonstrate functional improvement above the cut off score for adults.  : 09/06/2021 INITIAL  4 Pt will score >/= 57 on FOTO to demonstrate functional improvement in LBP.  : 09/06/2021 INITIAL   PLAN: PT FREQUENCY: 1-2x/week  PT DURATION: 12 weeks (likely D/C by 8)  PLANNED INTERVENTIONS: Therapeutic exercises, Therapeutic activity, Neuro Muscular re-education, Balance training, Gait training, Patient/Family education, Joint mobilization, Stair training, Prosthetic training, Aquatic Therapy, Dry Needling, Electrical stimulation, Spinal mobilization, Cryotherapy, Moist heat, scar mobilization, Taping, Vasopneumatic device, Traction, Ultrasound, Ionotophoresis 4mg /ml Dexamethasone, and Manual therapy  PLAN FOR NEXT SESSION: review HEP, joint mobilizations, review mobility, add in DL/RDL, bridging  Daleen Bo PT, DPT 06/14/21 1:47 PM

## 2021-06-21 DIAGNOSIS — J320 Chronic maxillary sinusitis: Secondary | ICD-10-CM | POA: Diagnosis not present

## 2021-06-23 ENCOUNTER — Encounter (HOSPITAL_BASED_OUTPATIENT_CLINIC_OR_DEPARTMENT_OTHER): Payer: PPO | Admitting: Physical Therapy

## 2021-06-30 ENCOUNTER — Other Ambulatory Visit: Payer: Self-pay

## 2021-06-30 ENCOUNTER — Ambulatory Visit (HOSPITAL_BASED_OUTPATIENT_CLINIC_OR_DEPARTMENT_OTHER): Payer: PPO | Attending: Orthopedic Surgery | Admitting: Physical Therapy

## 2021-06-30 ENCOUNTER — Encounter (HOSPITAL_BASED_OUTPATIENT_CLINIC_OR_DEPARTMENT_OTHER): Payer: Self-pay | Admitting: Physical Therapy

## 2021-06-30 DIAGNOSIS — M6281 Muscle weakness (generalized): Secondary | ICD-10-CM | POA: Diagnosis not present

## 2021-06-30 DIAGNOSIS — R262 Difficulty in walking, not elsewhere classified: Secondary | ICD-10-CM | POA: Insufficient documentation

## 2021-06-30 DIAGNOSIS — M545 Low back pain, unspecified: Secondary | ICD-10-CM | POA: Insufficient documentation

## 2021-06-30 NOTE — Therapy (Signed)
OUTPATIENT PHYSICAL THERAPY TREATMENT   Patient Name: Adrian Skinner MRN: [id number] DOB:1946-04-22, 76 y.o., male Today's Date: 06/30/2021   PT End of Session - 06/30/21 1257     Visit Number 3    Number of Visits 17    Date for PT Re-Evaluation 08/25/21    Authorization Type HTA    PT Start Time 1257    PT Stop Time 1340    PT Time Calculation (min) 43 min    Activity Tolerance Patient tolerated treatment well    Behavior During Therapy WFL for tasks assessed/performed             Past Medical History:  Diagnosis Date   Anemia    Anxiety    Back pain    Cataract    Environmental and seasonal allergies    Essential hypertension    GERD (gastroesophageal reflux disease)    Headache    Hiatal hernia    Hyperlipidemia    Insomnia    Migraine without status migrainosus, not intractable    OSA (obstructive sleep apnea) 11/25/2014   Reflux    RLS (restless legs syndrome)    Sleep apnea    Vertigo    Past Surgical History:  Procedure Laterality Date   LEFT HEART CATHETERIZATION WITH CORONARY ANGIOGRAM N/A 11/11/2013   Procedure: LEFT HEART CATHETERIZATION WITH CORONARY ANGIOGRAM;  Surgeon: Peter M Martinique, MD;  Location: [facility] CATH LAB;  Service: Cardiovascular;  Laterality: N/A;   ROTATOR CUFF REPAIR Right    right   TONSILLECTOMY AND ADENOIDECTOMY     Patient Active Problem List   Diagnosis Date Noted   Leg swelling 11/04/2020   Dyslipidemia 11/04/2020   Prediabetes 05/13/2020   Mild dehydration 05/13/2020   Vitamin D deficiency 05/13/2020   SOBOE (shortness of breath on exertion) 04/29/2020   Other fatigue 04/29/2020   Other hyperlipidemia 04/29/2020   Hypertension 04/29/2020   Mood disorder ([place] EE 04/29/2020   Educated about COVID-19 virus infection 01/29/2020   Essential hypertension 01/29/2020   Coronary artery disease involving native coronary artery of native heart without angina pectoris 01/29/2020   SOB (shortness of breath) 01/29/2020   OSA  (obstructive sleep apnea) 11/25/2014   Advanced sleep phase syndrome 11/25/2014   Chest pain 11/06/2013    PCP: Deland Pretty, MD  REFERRING PROVIDER: Deland Pretty, MD  REFERRING DIAG: M54.16 (ICD-10-CM) - Radiculopathy, lumbar region   THERAPY DIAG:  Pain, lumbar region  Difficulty walking  Muscle weakness (generalized)  ONSET DATE:  07/2020  SUBJECTIVE:  SUBJECTIVE STATEMENT: Pt states he has been sick the last few weeks. The back has no pain from laying in bed/resting. He had no pain after last sesion.    PERTINENT HISTORY:  Rectus diastis- 20 years  PAIN:  Are you having pain? no 0/10 (stiff/tightness)  Pain description: intermittent sharp, stabbing Aggravating factors: doing dishes, rotational movement, slight fwd Relieving factors: supported position, TENS, ice/heat, massage  PRECAUTIONS: None  OCCUPATION: Pt still works for [employer]. Sits most of the day. Works 40 hrs/week.   PLOF: Independent  PATIENT GOALS : Pt would like to return to golfing, daily life, and exercise pain free.    OBJECTIVE:    PATIENT SURVEYS:  FOTO 50 31 at D/C 5pts MCII   TODAY'S TREATMENT   Joint mob: bilat L1-5 CPA and bilat UPA grade III-IV  Exercises Wall ball rotation red swiss ball 20x  Golf swing recreation/analysis- lacks hip extension on R, decreased R hip ER, limited R lumbar S/B Butterfly stretch 30s 2x Fig 4 seated stretch 30s 2x each Segmental flexion 5x 15lb KB RDL 2x10 Seated cable rowing 2x10 70lbs, 50 lbs 10x warm up   PATIENT EDUCATION:  Education details:  exercise progression, abdominal bracing, muscle firing,  envelope of function, HEP, POC  Person educated: Patient Education method: Explanation, Demonstration, Tactile cues, Verbal cues, and Handouts Education  comprehension: verbalized understanding, returned demonstration, verbal cues required, and tactile cues required   HOME EXERCISE PROGRAM: Access Code: [email or web address] URL: [email or web address] Date: 05/27/2021 Prepared by: Daleen Bo   ASSESSMENT:  CLINICAL IMPRESSION: Pt able to improve back pain with golf/swing and rotation after improving bilateral hip ER ROM. Pt with posterior hip stiffness that is likely causing continued pain with ADL as well as exercise/recreation. Pt able to add in KB RDL and unsupported rowing at today's session. Plan to continue with strengthening at next session as HEP has been updated for independent maintenance/progression of hip and lumbar ROM. Pt with report of no pain at end of session.    Objective impairments include Abnormal gait, decreased activity tolerance, decreased balance, decreased endurance, decreased mobility, difficulty walking, decreased ROM, decreased strength, hypomobility, increased fascial restrictions, increased muscle spasms, impaired flexibility, improper body mechanics, postural dysfunction, and pain. These impairments are limiting patient from cleaning, community activity, driving, occupation, yard work, and [employer] . Personal factors including Age, Behavior pattern, Fitness, Time since onset of injury/illness/exacerbation, and 1-2 comorbidities:    are also affecting patient's functional outcome. Patient will benefit from skilled PT to address above impairments and improve overall function.  REHAB POTENTIAL: Good  CLINICAL DECISION MAKING: Stable/uncomplicated  EVALUATION COMPLEXITY: Low   GOALS: SHORT TERM GOALS:  STG Name Target Date Goal status  1 Pt will become independent with HEP in order to demonstrate synthesis of PT education.  :  07/14/2021 INITIAL  2 Pt will be able to demonstrate pain free L/S ROM in order to demonstrate functional improvement in lumbar function for self-care and house hold  duties.  :  08/11/2021 INITIAL  3 Pt will score at least 5 pt increase on FOTO to demonstrate functional improvement in MCII and pt perceived function.   : 08/11/2021 INITIAL  4 Pt will be able to demonstrate/report ability to sit/stand/sleep for extended periods of time without pain in order to demonstrate functional improvement and tolerance to static positioning.  : 08/11/2021 INITIAL   LONG TERM GOALS:   LTG Name Target Date Goal status  1 Pt  will become independent with final HEP in order  to demonstrate synthesis of PT education.  : 09/22/2021 INITIAL  2 Pt will be able to demonstrate ability to perform golf swing simulated motions without pain in order to demonstrate functional improvement in lumbar function for self-care and house hold duties.  : 09/22/2021 INITIAL  3 Pt will be able to perform 5XSTS in under 12s  in order to demonstrate functional improvement above the cut off score for adults.  : 09/22/2021 INITIAL  4 Pt will score >/= 57 on FOTO to demonstrate functional improvement in LBP.  : 09/22/2021 INITIAL   PLAN: PT FREQUENCY: 1-2x/week  PT DURATION: 12 weeks (likely D/C by 8)  PLANNED INTERVENTIONS: Therapeutic exercises, Therapeutic activity, Neuro Muscular re-education, Balance training, Gait training, Patient/Family education, Joint mobilization, Stair training, Prosthetic training, Aquatic Therapy, Dry Needling, Electrical stimulation, Spinal mobilization, Cryotherapy, Moist heat, scar mobilization, Taping, Vasopneumatic device, Traction, Ultrasound, Ionotophoresis 4mg /ml Dexamethasone, and Manual therapy  PLAN FOR NEXT SESSION: review HEP, joint mobilizations, review mobility, add in DL/RDL, bridging  Daleen Bo PT, DPT 06/30/21 1:45 PM

## 2021-07-04 DIAGNOSIS — J320 Chronic maxillary sinusitis: Secondary | ICD-10-CM | POA: Diagnosis not present

## 2021-07-04 DIAGNOSIS — J019 Acute sinusitis, unspecified: Secondary | ICD-10-CM | POA: Diagnosis not present

## 2021-07-05 ENCOUNTER — Encounter (HOSPITAL_BASED_OUTPATIENT_CLINIC_OR_DEPARTMENT_OTHER): Payer: PPO | Admitting: Physical Therapy

## 2021-07-19 ENCOUNTER — Encounter (HOSPITAL_BASED_OUTPATIENT_CLINIC_OR_DEPARTMENT_OTHER): Payer: PPO | Admitting: Physical Therapy

## 2021-07-19 DIAGNOSIS — L603 Nail dystrophy: Secondary | ICD-10-CM | POA: Diagnosis not present

## 2021-07-19 DIAGNOSIS — D2271 Melanocytic nevi of right lower limb, including hip: Secondary | ICD-10-CM | POA: Diagnosis not present

## 2021-07-19 DIAGNOSIS — L738 Other specified follicular disorders: Secondary | ICD-10-CM | POA: Diagnosis not present

## 2021-07-19 DIAGNOSIS — L820 Inflamed seborrheic keratosis: Secondary | ICD-10-CM | POA: Diagnosis not present

## 2021-07-19 DIAGNOSIS — D1801 Hemangioma of skin and subcutaneous tissue: Secondary | ICD-10-CM | POA: Diagnosis not present

## 2021-07-19 DIAGNOSIS — L565 Disseminated superficial actinic porokeratosis (DSAP): Secondary | ICD-10-CM | POA: Diagnosis not present

## 2021-07-19 DIAGNOSIS — D2272 Melanocytic nevi of left lower limb, including hip: Secondary | ICD-10-CM | POA: Diagnosis not present

## 2021-07-19 DIAGNOSIS — D225 Melanocytic nevi of trunk: Secondary | ICD-10-CM | POA: Diagnosis not present

## 2021-07-19 DIAGNOSIS — L821 Other seborrheic keratosis: Secondary | ICD-10-CM | POA: Diagnosis not present

## 2021-07-20 ENCOUNTER — Encounter (HOSPITAL_BASED_OUTPATIENT_CLINIC_OR_DEPARTMENT_OTHER): Payer: Self-pay | Admitting: Physical Therapy

## 2021-07-20 ENCOUNTER — Other Ambulatory Visit: Payer: Self-pay

## 2021-07-20 ENCOUNTER — Ambulatory Visit (HOSPITAL_BASED_OUTPATIENT_CLINIC_OR_DEPARTMENT_OTHER): Payer: PPO | Admitting: Physical Therapy

## 2021-07-20 DIAGNOSIS — M6281 Muscle weakness (generalized): Secondary | ICD-10-CM

## 2021-07-20 DIAGNOSIS — R262 Difficulty in walking, not elsewhere classified: Secondary | ICD-10-CM

## 2021-07-20 DIAGNOSIS — M545 Low back pain, unspecified: Secondary | ICD-10-CM

## 2021-07-20 NOTE — Therapy (Signed)
OUTPATIENT PHYSICAL THERAPY TREATMENT   Patient Name: Adrian Skinner MRN: [id number] DOB:Feb 17, 1946, 76 y.o., male Today's Date: 07/20/2021   PT End of Session - 07/20/21 0942     Visit Number 4    Number of Visits 17    Date for PT Re-Evaluation 08/25/21    Authorization Type HTA    PT Start Time 0803    PT Stop Time 0845    PT Time Calculation (min) 42 min    Activity Tolerance Patient tolerated treatment well    Behavior During Therapy WFL for tasks assessed/performed              Past Medical History:  Diagnosis Date   Anemia    Anxiety    Back pain    Cataract    Environmental and seasonal allergies    Essential hypertension    GERD (gastroesophageal reflux disease)    Headache    Hiatal hernia    Hyperlipidemia    Insomnia    Migraine without status migrainosus, not intractable    OSA (obstructive sleep apnea) 11/25/2014   Reflux    RLS (restless legs syndrome)    Sleep apnea    Vertigo    Past Surgical History:  Procedure Laterality Date   LEFT HEART CATHETERIZATION WITH CORONARY ANGIOGRAM N/A 11/11/2013   Procedure: LEFT HEART CATHETERIZATION WITH CORONARY ANGIOGRAM;  Surgeon: Peter M Martinique, MD;  Location: [facility] CATH LAB;  Service: Cardiovascular;  Laterality: N/A;   ROTATOR CUFF REPAIR Right    right   TONSILLECTOMY AND ADENOIDECTOMY     Patient Active Problem List   Diagnosis Date Noted   Leg swelling 11/04/2020   Dyslipidemia 11/04/2020   Prediabetes 05/13/2020   Mild dehydration 05/13/2020   Vitamin D deficiency 05/13/2020   SOBOE (shortness of breath on exertion) 04/29/2020   Other fatigue 04/29/2020   Other hyperlipidemia 04/29/2020   Hypertension 04/29/2020   Mood disorder ([place])- EE 04/29/2020   Educated about COVID-19 virus infection 01/29/2020   Essential hypertension 01/29/2020   Coronary artery disease involving native coronary artery of native heart without angina pectoris 01/29/2020   SOB (shortness of breath) 01/29/2020   OSA  (obstructive sleep apnea) 11/25/2014   Advanced sleep phase syndrome 11/25/2014   Chest pain 11/06/2013    PCP: Deland Pretty, MD  REFERRING PROVIDER: Deland Pretty, MD  REFERRING DIAG: M54.16 (ICD-10-CM) - Radiculopathy, lumbar region   THERAPY DIAG:  Pain, lumbar region  Muscle weakness (generalized)  Difficulty walking  ONSET DATE:  07/2020  SUBJECTIVE:  SUBJECTIVE STATEMENT: Pt states he has strained his back recently while taking care of his wife who had foot surgery. Pt states stooping and doing more ADL to assist spouse throughout the day getting up and down caused him to hurt his back. Pt is interested in TPDN today.    PERTINENT HISTORY:  Rectus diastis- 20 years  PAIN:  Are you having pain? Yes 7/10 (stiff/tightness)  Pain description: intermittent sharp, stabbing Aggravating factors: doing dishes, rotational movement, slight fwd Relieving factors: supported position, TENS, ice/heat, massage  PRECAUTIONS: None  OCCUPATION: Pt still works for [employer]. Sits most of the day. Works 40 hrs/week.   PLOF: Independent  PATIENT GOALS : Pt would like to return to golfing, daily life, and exercise pain free.    OBJECTIVE:    PATIENT SURVEYS:  FOTO 50 38 at D/C 5pts MCII   TODAY'S TREATMENT   TPDN: skilled palpation and assessment of soft tissue response throughout; twitch response elicited in bilateral lumbar paraspinals and in region of lumbar multifidi  Joint mob: bilat L1-5 CPA and bilat UPA grade III-IV STM: bilat lumbar paraspinals and QL  Exercises  Butterfly stretch 30s 2x Fig 4 seated stretch 30s 2x each Seated lumbar flexion with swiss 5s 10x    PATIENT EDUCATION:  Education details: TPDN with verbal consent given, self pain management techniques, anatomy,  mobility work at home, activity modifications to reduce lumbar load, exercise progression, abdominal bracing, envelope of function, HEP, POC  Person educated: Patient Education method: Explanation, Demonstration, Tactile cues, Verbal cues, and Handouts Education comprehension: verbalized understanding, returned demonstration, verbal cues required, and tactile cues required   HOME EXERCISE PROGRAM: Access Code: [email or web address] URL: [email or web address] Date: 05/27/2021 Prepared by: Daleen Bo   ASSESSMENT:  CLINICAL IMPRESSION: Pt presents with significant pain and stiffness into lumbar spine at today's session due to 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 Pt with increased hypertonicity and tenderness to palpation form T12-L3. DN performed from L2 and below in bilateral paraspinals and reigion of multifidi. Pt did have improved ROM and reduction of pain to 3/10 by end of session. Plan to follow up with pt sooner vs later in order to improved mobility and flexibility. Pt advised on activity modification and consistent mobility exercise in order to reduce joint stiffness.    Objective impairments include Abnormal gait, decreased activity tolerance, decreased balance, decreased endurance, decreased mobility, difficulty walking, decreased ROM, decreased strength, hypomobility, increased fascial restrictions, increased muscle spasms, impaired flexibility, improper body mechanics, postural dysfunction, and pain. These impairments are limiting patient from cleaning, community activity, driving, occupation, yard work, and [employer] . Personal factors including Age, Behavior pattern, Fitness, Time since onset of injury/illness/exacerbation, and 1-2 comorbidities:    are also affecting patient's functional outcome. Patient will benefit from skilled PT to address above impairments and improve overall function.  REHAB POTENTIAL: Good  CLINICAL DECISION MAKING:  Stable/uncomplicated  EVALUATION COMPLEXITY: Low   GOALS: SHORT TERM GOALS:  STG Name Target Date Goal status  1 Pt will become independent with HEP in order to demonstrate synthesis of PT education.  :  08/03/2021 ongoing  2 Pt will be able to demonstrate pain free L/S ROM in order to demonstrate functional improvement in lumbar function for self-care and house hold duties.  :  08/31/2021 ongoing  3 Pt will score at least 5 pt increase on FOTO to demonstrate functional improvement in MCII and pt perceived function.   : 08/31/2021 INITIAL  4 Pt will be able to  demonstrate/report ability to sit/stand/sleep for extended periods of time without pain in order to demonstrate functional improvement and tolerance to static positioning.  : 08/31/2021 met   LONG TERM GOALS:   LTG Name Target Date Goal status  1 Pt  will become independent with final HEP in order to demonstrate synthesis of PT education.  : 10/12/2021 ongoing  2 Pt will be able to demonstrate ability to perform golf swing simulated motions without pain in order to demonstrate functional improvement in lumbar function for self-care and house hold duties.  : 10/12/2021 Partially met  3 Pt will be able to perform 5XSTS in under 12s  in order to demonstrate functional improvement above the cut off score for adults.  : 10/12/2021 Partially met  4 Pt will score >/= 57 on FOTO to demonstrate functional improvement in LBP.  : 10/12/2021 INITIAL   PLAN: PT FREQUENCY: 1-2x/week  PT DURATION: 12 weeks (likely D/C by 8)  PLANNED INTERVENTIONS: Therapeutic exercises, Therapeutic activity, Neuro Muscular re-education, Balance training, Gait training, Patient/Family education, Joint mobilization, Stair training, Prosthetic training, Aquatic Therapy, Dry Needling, Electrical stimulation, Spinal mobilization, Cryotherapy, Moist heat, scar mobilization, Taping, Vasopneumatic device, Traction, Ultrasound, Ionotophoresis 7m/ml Dexamethasone, and  Manual therapy  PLAN FOR NEXT SESSION: review HEP, joint mobilizations, review mobility, add in DL/RDL, bridging  ADaleen BoPT, DPT 07/20/21 9:45 AM

## 2021-07-26 DIAGNOSIS — R972 Elevated prostate specific antigen [PSA]: Secondary | ICD-10-CM | POA: Diagnosis not present

## 2021-07-26 DIAGNOSIS — M545 Low back pain, unspecified: Secondary | ICD-10-CM | POA: Diagnosis not present

## 2021-07-26 DIAGNOSIS — S39012A Strain of muscle, fascia and tendon of lower back, initial encounter: Secondary | ICD-10-CM | POA: Diagnosis not present

## 2021-07-26 DIAGNOSIS — N5201 Erectile dysfunction due to arterial insufficiency: Secondary | ICD-10-CM | POA: Diagnosis not present

## 2021-07-26 DIAGNOSIS — R3915 Urgency of urination: Secondary | ICD-10-CM | POA: Diagnosis not present

## 2021-07-28 DIAGNOSIS — M545 Low back pain, unspecified: Secondary | ICD-10-CM | POA: Diagnosis not present

## 2021-08-02 DIAGNOSIS — M545 Low back pain, unspecified: Secondary | ICD-10-CM | POA: Diagnosis not present

## 2021-08-02 DIAGNOSIS — M47816 Spondylosis without myelopathy or radiculopathy, lumbar region: Secondary | ICD-10-CM | POA: Diagnosis not present

## 2021-08-04 DIAGNOSIS — M545 Low back pain, unspecified: Secondary | ICD-10-CM | POA: Diagnosis not present

## 2021-08-04 DIAGNOSIS — M47816 Spondylosis without myelopathy or radiculopathy, lumbar region: Secondary | ICD-10-CM | POA: Diagnosis not present

## 2021-08-08 ENCOUNTER — Ambulatory Visit (HOSPITAL_BASED_OUTPATIENT_CLINIC_OR_DEPARTMENT_OTHER): Payer: PPO | Admitting: Physical Therapy

## 2021-08-08 DIAGNOSIS — I10 Essential (primary) hypertension: Secondary | ICD-10-CM | POA: Diagnosis not present

## 2021-08-08 DIAGNOSIS — G43009 Migraine without aura, not intractable, without status migrainosus: Secondary | ICD-10-CM | POA: Diagnosis not present

## 2021-08-09 DIAGNOSIS — M47816 Spondylosis without myelopathy or radiculopathy, lumbar region: Secondary | ICD-10-CM | POA: Diagnosis not present

## 2021-08-09 DIAGNOSIS — M545 Low back pain, unspecified: Secondary | ICD-10-CM | POA: Diagnosis not present

## 2021-08-11 DIAGNOSIS — H0100A Unspecified blepharitis right eye, upper and lower eyelids: Secondary | ICD-10-CM | POA: Diagnosis not present

## 2021-08-11 DIAGNOSIS — M47816 Spondylosis without myelopathy or radiculopathy, lumbar region: Secondary | ICD-10-CM | POA: Diagnosis not present

## 2021-08-11 DIAGNOSIS — T1501XA Foreign body in cornea, right eye, initial encounter: Secondary | ICD-10-CM | POA: Diagnosis not present

## 2021-08-11 DIAGNOSIS — M545 Low back pain, unspecified: Secondary | ICD-10-CM | POA: Diagnosis not present

## 2021-08-11 DIAGNOSIS — H1820 Unspecified corneal edema: Secondary | ICD-10-CM | POA: Diagnosis not present

## 2021-08-11 DIAGNOSIS — H10413 Chronic giant papillary conjunctivitis, bilateral: Secondary | ICD-10-CM | POA: Diagnosis not present

## 2021-08-15 ENCOUNTER — Encounter (HOSPITAL_BASED_OUTPATIENT_CLINIC_OR_DEPARTMENT_OTHER): Payer: PPO | Admitting: Physical Therapy

## 2021-08-16 DIAGNOSIS — M545 Low back pain, unspecified: Secondary | ICD-10-CM | POA: Diagnosis not present

## 2021-08-16 DIAGNOSIS — M47816 Spondylosis without myelopathy or radiculopathy, lumbar region: Secondary | ICD-10-CM | POA: Diagnosis not present

## 2021-08-18 DIAGNOSIS — M47816 Spondylosis without myelopathy or radiculopathy, lumbar region: Secondary | ICD-10-CM | POA: Diagnosis not present

## 2021-08-18 DIAGNOSIS — M545 Low back pain, unspecified: Secondary | ICD-10-CM | POA: Diagnosis not present

## 2021-08-23 DIAGNOSIS — M47816 Spondylosis without myelopathy or radiculopathy, lumbar region: Secondary | ICD-10-CM | POA: Diagnosis not present

## 2021-08-23 DIAGNOSIS — M545 Low back pain, unspecified: Secondary | ICD-10-CM | POA: Diagnosis not present

## 2021-08-25 DIAGNOSIS — G4733 Obstructive sleep apnea (adult) (pediatric): Secondary | ICD-10-CM | POA: Diagnosis not present

## 2021-08-25 DIAGNOSIS — M47816 Spondylosis without myelopathy or radiculopathy, lumbar region: Secondary | ICD-10-CM | POA: Diagnosis not present

## 2021-08-25 DIAGNOSIS — M545 Low back pain, unspecified: Secondary | ICD-10-CM | POA: Diagnosis not present

## 2021-08-30 DIAGNOSIS — M545 Low back pain, unspecified: Secondary | ICD-10-CM | POA: Diagnosis not present

## 2021-08-30 DIAGNOSIS — M47816 Spondylosis without myelopathy or radiculopathy, lumbar region: Secondary | ICD-10-CM | POA: Diagnosis not present

## 2021-09-06 DIAGNOSIS — G4733 Obstructive sleep apnea (adult) (pediatric): Secondary | ICD-10-CM | POA: Diagnosis not present

## 2021-09-22 DIAGNOSIS — H04123 Dry eye syndrome of bilateral lacrimal glands: Secondary | ICD-10-CM | POA: Diagnosis not present

## 2021-09-22 DIAGNOSIS — M545 Low back pain, unspecified: Secondary | ICD-10-CM | POA: Diagnosis not present

## 2021-09-22 DIAGNOSIS — G8929 Other chronic pain: Secondary | ICD-10-CM | POA: Diagnosis not present

## 2021-09-22 DIAGNOSIS — H0100A Unspecified blepharitis right eye, upper and lower eyelids: Secondary | ICD-10-CM | POA: Diagnosis not present

## 2021-09-22 DIAGNOSIS — H0100B Unspecified blepharitis left eye, upper and lower eyelids: Secondary | ICD-10-CM | POA: Diagnosis not present

## 2021-09-29 DIAGNOSIS — Z0000 Encounter for general adult medical examination without abnormal findings: Secondary | ICD-10-CM | POA: Diagnosis not present

## 2021-09-29 DIAGNOSIS — D649 Anemia, unspecified: Secondary | ICD-10-CM | POA: Diagnosis not present

## 2021-09-29 DIAGNOSIS — I10 Essential (primary) hypertension: Secondary | ICD-10-CM | POA: Diagnosis not present

## 2021-09-29 DIAGNOSIS — R5383 Other fatigue: Secondary | ICD-10-CM | POA: Diagnosis not present

## 2021-09-29 DIAGNOSIS — R972 Elevated prostate specific antigen [PSA]: Secondary | ICD-10-CM | POA: Diagnosis not present

## 2021-09-29 DIAGNOSIS — Z125 Encounter for screening for malignant neoplasm of prostate: Secondary | ICD-10-CM | POA: Diagnosis not present

## 2021-09-29 DIAGNOSIS — E782 Mixed hyperlipidemia: Secondary | ICD-10-CM | POA: Diagnosis not present

## 2021-10-18 DIAGNOSIS — M4306 Spondylolysis, lumbar region: Secondary | ICD-10-CM | POA: Diagnosis not present

## 2021-10-18 DIAGNOSIS — M4135 Thoracogenic scoliosis, thoracolumbar region: Secondary | ICD-10-CM | POA: Diagnosis not present

## 2021-10-20 DIAGNOSIS — H16223 Keratoconjunctivitis sicca, not specified as Sjogren's, bilateral: Secondary | ICD-10-CM | POA: Diagnosis not present

## 2021-10-20 DIAGNOSIS — H0100A Unspecified blepharitis right eye, upper and lower eyelids: Secondary | ICD-10-CM | POA: Diagnosis not present

## 2021-10-20 DIAGNOSIS — H0100B Unspecified blepharitis left eye, upper and lower eyelids: Secondary | ICD-10-CM | POA: Diagnosis not present

## 2021-10-20 DIAGNOSIS — H02833 Dermatochalasis of right eye, unspecified eyelid: Secondary | ICD-10-CM | POA: Diagnosis not present

## 2021-10-25 DIAGNOSIS — M4306 Spondylolysis, lumbar region: Secondary | ICD-10-CM | POA: Diagnosis not present

## 2021-10-25 DIAGNOSIS — S39012A Strain of muscle, fascia and tendon of lower back, initial encounter: Secondary | ICD-10-CM | POA: Diagnosis not present

## 2021-10-25 DIAGNOSIS — M4135 Thoracogenic scoliosis, thoracolumbar region: Secondary | ICD-10-CM | POA: Diagnosis not present

## 2021-10-27 DIAGNOSIS — M4306 Spondylolysis, lumbar region: Secondary | ICD-10-CM | POA: Diagnosis not present

## 2021-10-27 DIAGNOSIS — M4135 Thoracogenic scoliosis, thoracolumbar region: Secondary | ICD-10-CM | POA: Diagnosis not present

## 2021-11-07 DIAGNOSIS — M4135 Thoracogenic scoliosis, thoracolumbar region: Secondary | ICD-10-CM | POA: Diagnosis not present

## 2021-11-07 DIAGNOSIS — M4306 Spondylolysis, lumbar region: Secondary | ICD-10-CM | POA: Diagnosis not present

## 2021-11-18 DIAGNOSIS — M25551 Pain in right hip: Secondary | ICD-10-CM | POA: Diagnosis not present

## 2021-11-18 DIAGNOSIS — M25552 Pain in left hip: Secondary | ICD-10-CM | POA: Diagnosis not present

## 2021-11-24 DIAGNOSIS — M1711 Unilateral primary osteoarthritis, right knee: Secondary | ICD-10-CM | POA: Diagnosis not present

## 2021-11-24 DIAGNOSIS — M25561 Pain in right knee: Secondary | ICD-10-CM | POA: Diagnosis not present

## 2021-11-24 DIAGNOSIS — M25461 Effusion, right knee: Secondary | ICD-10-CM | POA: Diagnosis not present

## 2021-11-24 DIAGNOSIS — S83411A Sprain of medial collateral ligament of right knee, initial encounter: Secondary | ICD-10-CM | POA: Diagnosis not present

## 2021-11-30 DIAGNOSIS — M6208 Separation of muscle (nontraumatic), other site: Secondary | ICD-10-CM | POA: Diagnosis not present

## 2021-12-26 DIAGNOSIS — R972 Elevated prostate specific antigen [PSA]: Secondary | ICD-10-CM | POA: Diagnosis not present

## 2022-01-03 DIAGNOSIS — R972 Elevated prostate specific antigen [PSA]: Secondary | ICD-10-CM | POA: Diagnosis not present

## 2022-01-03 DIAGNOSIS — N5201 Erectile dysfunction due to arterial insufficiency: Secondary | ICD-10-CM | POA: Diagnosis not present

## 2022-01-03 DIAGNOSIS — R3915 Urgency of urination: Secondary | ICD-10-CM | POA: Diagnosis not present

## 2022-01-18 DIAGNOSIS — G4733 Obstructive sleep apnea (adult) (pediatric): Secondary | ICD-10-CM | POA: Diagnosis not present

## 2022-01-25 DIAGNOSIS — M5451 Vertebrogenic low back pain: Secondary | ICD-10-CM | POA: Diagnosis not present

## 2022-01-25 DIAGNOSIS — M79672 Pain in left foot: Secondary | ICD-10-CM | POA: Diagnosis not present

## 2022-01-25 DIAGNOSIS — M4186 Other forms of scoliosis, lumbar region: Secondary | ICD-10-CM | POA: Diagnosis not present

## 2022-01-27 DIAGNOSIS — R944 Abnormal results of kidney function studies: Secondary | ICD-10-CM | POA: Diagnosis not present

## 2022-01-27 DIAGNOSIS — M1180 Other specified crystal arthropathies, unspecified site: Secondary | ICD-10-CM | POA: Diagnosis not present

## 2022-01-27 DIAGNOSIS — M549 Dorsalgia, unspecified: Secondary | ICD-10-CM | POA: Diagnosis not present

## 2022-01-27 DIAGNOSIS — M109 Gout, unspecified: Secondary | ICD-10-CM | POA: Diagnosis not present

## 2022-01-27 DIAGNOSIS — M1990 Unspecified osteoarthritis, unspecified site: Secondary | ICD-10-CM | POA: Diagnosis not present

## 2022-01-27 DIAGNOSIS — M7989 Other specified soft tissue disorders: Secondary | ICD-10-CM | POA: Diagnosis not present

## 2022-01-27 DIAGNOSIS — M79676 Pain in unspecified toe(s): Secondary | ICD-10-CM | POA: Diagnosis not present

## 2022-01-27 DIAGNOSIS — M2550 Pain in unspecified joint: Secondary | ICD-10-CM | POA: Diagnosis not present

## 2022-02-01 ENCOUNTER — Encounter (INDEPENDENT_AMBULATORY_CARE_PROVIDER_SITE_OTHER): Payer: Self-pay

## 2022-02-06 DIAGNOSIS — S39012A Strain of muscle, fascia and tendon of lower back, initial encounter: Secondary | ICD-10-CM | POA: Diagnosis not present

## 2022-02-17 DIAGNOSIS — G4733 Obstructive sleep apnea (adult) (pediatric): Secondary | ICD-10-CM | POA: Diagnosis not present

## 2022-02-20 DIAGNOSIS — Z0289 Encounter for other administrative examinations: Secondary | ICD-10-CM

## 2022-02-21 ENCOUNTER — Ambulatory Visit (INDEPENDENT_AMBULATORY_CARE_PROVIDER_SITE_OTHER): Payer: PPO | Admitting: Family Medicine

## 2022-02-21 ENCOUNTER — Encounter (INDEPENDENT_AMBULATORY_CARE_PROVIDER_SITE_OTHER): Payer: Self-pay | Admitting: Family Medicine

## 2022-02-21 VITALS — BP 116/73 | HR 65 | Temp 97.6°F | Ht 70.0 in | Wt 223.0 lb

## 2022-02-21 DIAGNOSIS — Z6832 Body mass index (BMI) 32.0-32.9, adult: Secondary | ICD-10-CM

## 2022-02-21 DIAGNOSIS — E669 Obesity, unspecified: Secondary | ICD-10-CM | POA: Diagnosis not present

## 2022-02-21 DIAGNOSIS — I10 Essential (primary) hypertension: Secondary | ICD-10-CM

## 2022-02-21 DIAGNOSIS — F419 Anxiety disorder, unspecified: Secondary | ICD-10-CM | POA: Diagnosis not present

## 2022-02-21 DIAGNOSIS — E559 Vitamin D deficiency, unspecified: Secondary | ICD-10-CM

## 2022-02-21 DIAGNOSIS — R5383 Other fatigue: Secondary | ICD-10-CM

## 2022-02-21 DIAGNOSIS — Z9989 Dependence on other enabling machines and devices: Secondary | ICD-10-CM

## 2022-02-21 DIAGNOSIS — E7849 Other hyperlipidemia: Secondary | ICD-10-CM

## 2022-02-21 DIAGNOSIS — G4733 Obstructive sleep apnea (adult) (pediatric): Secondary | ICD-10-CM | POA: Diagnosis not present

## 2022-02-21 DIAGNOSIS — Z1331 Encounter for screening for depression: Secondary | ICD-10-CM

## 2022-02-21 DIAGNOSIS — R0602 Shortness of breath: Secondary | ICD-10-CM

## 2022-02-21 DIAGNOSIS — R739 Hyperglycemia, unspecified: Secondary | ICD-10-CM | POA: Diagnosis not present

## 2022-02-21 DIAGNOSIS — M25512 Pain in left shoulder: Secondary | ICD-10-CM | POA: Diagnosis not present

## 2022-02-22 LAB — CBC WITH DIFFERENTIAL/PLATELET
Basophils Absolute: 0 10*3/uL (ref 0.0–0.2)
Basos: 0 %
EOS (ABSOLUTE): 0.1 10*3/uL (ref 0.0–0.4)
Eos: 1 %
Hematocrit: 43.4 % (ref 37.5–51.0)
Hemoglobin: 14.5 g/dL (ref 13.0–17.7)
Immature Grans (Abs): 0 10*3/uL (ref 0.0–0.1)
Immature Granulocytes: 0 %
Lymphocytes Absolute: 1.6 10*3/uL (ref 0.7–3.1)
Lymphs: 21 %
MCH: 31.2 pg (ref 26.6–33.0)
MCHC: 33.4 g/dL (ref 31.5–35.7)
MCV: 93 fL (ref 79–97)
Monocytes Absolute: 0.5 10*3/uL (ref 0.1–0.9)
Monocytes: 7 %
Neutrophils Absolute: 5.6 10*3/uL (ref 1.4–7.0)
Neutrophils: 71 %
Platelets: 220 10*3/uL (ref 150–450)
RBC: 4.65 x10E6/uL (ref 4.14–5.80)
RDW: 13.1 % (ref 11.6–15.4)
WBC: 7.8 10*3/uL (ref 3.4–10.8)

## 2022-02-22 LAB — COMPREHENSIVE METABOLIC PANEL
ALT: 21 IU/L (ref 0–44)
AST: 20 IU/L (ref 0–40)
Albumin/Globulin Ratio: 1.7 (ref 1.2–2.2)
Albumin: 4.4 g/dL (ref 3.8–4.8)
Alkaline Phosphatase: 95 IU/L (ref 44–121)
BUN/Creatinine Ratio: 20 (ref 10–24)
BUN: 24 mg/dL (ref 8–27)
Bilirubin Total: 0.5 mg/dL (ref 0.0–1.2)
CO2: 23 mmol/L (ref 20–29)
Calcium: 9.6 mg/dL (ref 8.6–10.2)
Chloride: 99 mmol/L (ref 96–106)
Creatinine, Ser: 1.22 mg/dL (ref 0.76–1.27)
Globulin, Total: 2.6 g/dL (ref 1.5–4.5)
Glucose: 83 mg/dL (ref 70–99)
Potassium: 3.8 mmol/L (ref 3.5–5.2)
Sodium: 140 mmol/L (ref 134–144)
Total Protein: 7 g/dL (ref 6.0–8.5)
eGFR: 61 mL/min/{1.73_m2} (ref >59)

## 2022-02-22 LAB — TSH: TSH: 2.25 u[IU]/mL (ref 0.450–4.500)

## 2022-02-22 LAB — LIPID PANEL WITH LDL/HDL RATIO
Cholesterol, Total: 191 mg/dL (ref 100–199)
HDL: 46 mg/dL (ref >39)
LDL Chol Calc (NIH): 83 mg/dL (ref 0–99)
LDL/HDL Ratio: 1.8 ratio (ref 0.0–3.6)
Triglycerides: 385 mg/dL — ABNORMAL HIGH (ref 0–149)
VLDL Cholesterol Cal: 62 mg/dL — ABNORMAL HIGH (ref 5–40)

## 2022-02-22 LAB — HEMOGLOBIN A1C
Est. average glucose Bld gHb Est-mCnc: 117 mg/dL
Hgb A1c MFr Bld: 5.7 % — ABNORMAL HIGH (ref 4.8–5.6)

## 2022-02-22 LAB — T4, FREE: Free T4: 1.46 ng/dL (ref 0.82–1.77)

## 2022-02-22 LAB — T3: T3, Total: 111 ng/dL (ref 71–180)

## 2022-02-22 LAB — INSULIN, RANDOM: INSULIN: 12.3 u[IU]/mL (ref 2.6–24.9)

## 2022-02-22 LAB — VITAMIN D 25 HYDROXY (VIT D DEFICIENCY, FRACTURES): Vit D, 25-Hydroxy: 30.1 ng/mL (ref 30.0–100.0)

## 2022-02-23 ENCOUNTER — Ambulatory Visit (INDEPENDENT_AMBULATORY_CARE_PROVIDER_SITE_OTHER): Payer: PPO | Admitting: Family Medicine

## 2022-02-27 NOTE — Progress Notes (Signed)
Chief Complaint:   Adrian Skinner (MR# [id number]) is a 76 y.o. male who presents for evaluation and treatment of Adrian and related comorbidities. Current BMI is Body mass index is 32 kg/m. Adrian Skinner has been struggling with his weight for many years and has been unsuccessful in either losing weight, maintaining weight loss, or reaching his healthy weight goal.  Adrian Skinner is currently in the action stage of change and ready to dedicate time achieving and maintaining a healthier weight. Adrian Skinner is interested in becoming our patient and working on intensive lifestyle modifications including (but not limited to) diet and exercise for weight loss.  Adrian Skinner is a returning patient. He saw Dr Adrian Skinner in 2021. Adrian Skinner eats [place], ham, and chicken. He likes raw veggies. At 4:00am, Adrian Skinner eats 2 cups of cereal with skim milk or an English muffin with butter. Between 9:00 and 10:00 am, he eats an Energy bar or a sausage biscuit that gets him to lunch. At 1:00 to 2:00, Adrian Skinner gets vegetables, with peppercorn dressing from the salad bar at [employer], a grilled cheese with tomato soup, a chopped tray with slaw, or a single chicken kebab skewer with salad. He feels satisfied. Adrian Skinner has dinner around 6:30 or 7:00 and has 1 cup of brussel sprouts, 1 cup of rice and 1/2 a cup of meat, in which he feels satisfied.  Adrian Skinner's habits were reviewed today and are as follows: His family eats meals together, he thinks his family will eat healthier with him, his desired weight loss is 43, he started gaining weight in 2019, his heaviest weight ever was 235 pounds, he is a picky eater and doesn't like to eat healthier foods, he has significant food cravings issues, he is frequently drinking liquids with calories, he frequently makes poor food choices, and he has binge eating behaviors.  Depression Screen Adrian Skinner's Food and Mood (modified PHQ-9) score was 7.     02/21/2022    8:27 AM  Depression screen PHQ 2/9   Decreased Interest 1  Down, Depressed, Hopeless 1  PHQ - 2 Score 2  Altered sleeping 1  Tired, decreased energy 2  Change in appetite 1  Feeling bad or failure about yourself  1  Trouble concentrating 0  Moving slowly or fidgety/restless 0  Suicidal thoughts 0  PHQ-9 Score 7  Difficult doing work/chores Not difficult at all   Subjective:   1. Other fatigue [name]'s EKG shows a normal sinus rhythm at 84 bpm. Adrian Skinner admits to daytime somnolence and admits to waking up still tired. Patient has a history of symptoms of Epworth sleepiness scale and hypertension. Adrian Skinner generally gets 4 or 5 hours of sleep per night, and states that he has generally restless sleep. Snoring is present. Apneic episodes are present. Epworth Sleepiness Score is 19.   2. SOBOE (shortness of breath on exertion) Adrian Skinner notes increasing shortness of breath with exercising and seems to be worsening over time with weight gain. He notes getting out of breath sooner with activity than he used to. This has not gotten worse recently. [name] denies shortness of breath at rest or orthopnea.  3. Other hyperlipidemia Adrian Skinner's LDL was 90, HDL was 43, and his Triglycerides were 286. He was diagnosed about 35 years ago. Adrian Skinner is on [place] and the treatment is resistant to a statin.  4. Essential hypertension [name] is on Triamterene '10mg'$  and his blood pressure is well controlled today. He has not had any blood pressure issues in  years.  5. OSA on CPAP [name] has a CPAP and wears it nightly, but he only sleeps a few hours a night. He sees Adrian Skinner, but he is retiring in September.  6. Elevated blood sugar Adrian Skinner's A1c in 2021 was 5.8 and his insulin was 8.9.  7. Vitamin D deficiency Adrian Skinner's last vitamin D level was 31.5 and he is not on a vitamin D supplement.  8. Anxiety Adrian Skinner is on sertraline '25mg'$  daily and denies having symptoms currently.  Assessment/Plan:   1. Other fatigue Adrian Skinner does feel that his weight is causing  his energy to be lower than it should be. Fatigue may be related to Adrian, depression or many other causes. Labs will be ordered, and in the meanwhile, [name] will focus on self care including making healthy food choices, increasing physical activity and focusing on stress reduction.   - EKG 12-Lead - TSH - T4, free - T3  2. SOBOE (shortness of breath on exertion) Adrian Skinner does feel that he gets out of breath more easily that he used to when he exercises. Adrian Skinner's shortness of breath appears to be Adrian related and exercise induced. He has agreed to work on weight loss and gradually increase exercise to treat his exercise induced shortness of breath. Will continue to monitor closely.  - CBC with Differential/Platelet  3. Other hyperlipidemia Labs were drawn today and Adrian Skinner agrees to follow up as directed.  - Lipid Panel With LDL/HDL Ratio  4. Essential hypertension Labs were obtained today and will be discussed at the next office visit in 2 weeks.  - Comprehensive metabolic panel  5. OSA on CPAP We will follow up on Adrian Skinner's sleep at the next appointment.  6. Elevated blood sugar Labs were drawn today and [name] agrees to follow up as directed.  - Hemoglobin A1c - Insulin, random  7. Vitamin D deficiency Labs were obtained today.  - VITAMIN D 25 Hydroxy (Vit-D Deficiency, Fractures)  8. Anxiety We will follow up on Adrian Skinner's symptoms at the next appointment.  9. Depression screening Adrian Skinner had a negative depression screening. Depression is commonly associated with Adrian and often results in emotional eating behaviors. We will monitor this closely and work on CBT to help improve the non-hunger eating patterns.  10. Class 1 Adrian with serious comorbidity and body mass index (BMI) of 32.0 to 32.9 in adult, unspecified Adrian type Adrian Skinner is currently in the action stage of change and his goal is to continue with weight loss efforts. I recommend Adrian Skinner begin the structured  treatment plan as follows:  He has agreed to the Category 3 Plan with 8 ounces at dinner.  Exercise goals: No exercise has been prescribed at this time.   Behavioral modification strategies: increasing lean protein intake, meal planning and cooking strategies, keeping healthy foods in the home, better snacking choices, and planning for success.  He was informed of the importance of frequent follow-up visits to maximize his success with intensive lifestyle modifications for his multiple health conditions. He was informed we would discuss his lab results at his next visit unless there is a critical issue that needs to be addressed sooner. Adrian Skinner agreed to keep his next visit at the agreed upon time to discuss these results.  Objective:   Blood pressure 116/73, pulse 65, temperature 97.6 F (36.4 C), height '5\' 10"'$  (1.778 m), weight 223 lb (101.2 kg), SpO2 98 %. Body mass index is 32 kg/m.  EKG: Normal sinus rhythm, rate 84.  Indirect Calorimeter completed today  shows a VO2 of 222 and a REE of 1526.  His calculated basal metabolic rate is 0109 thus his basal metabolic rate is worse than expected.  General: Cooperative, alert, well developed, in no acute distress. HEENT: Conjunctivae and lids unremarkable. Cardiovascular: Regular rhythm.  Lungs: Normal work of breathing. Neurologic: No focal deficits.   Lab Results  Component Value Date   CREATININE 1.22 02/21/2022   BUN 24 02/21/2022   NA 140 02/21/2022   K 3.8 02/21/2022   CL 99 02/21/2022   CO2 23 02/21/2022   Lab Results  Component Value Date   ALT 21 02/21/2022   AST 20 02/21/2022   ALKPHOS 95 02/21/2022   BILITOT 0.5 02/21/2022   Lab Results  Component Value Date   HGBA1C 5.7 (H) 02/21/2022   HGBA1C 5.8 (H) 04/29/2020   Lab Results  Component Value Date   INSULIN 12.3 02/21/2022   INSULIN 8.9 04/29/2020   Lab Results  Component Value Date   TSH 2.250 02/21/2022   Lab Results  Component Value Date   CHOL 191  02/21/2022   HDL 46 02/21/2022   LDLCALC 83 02/21/2022   TRIG 385 (H) 02/21/2022   CHOLHDL 4.2 04/29/2020   Lab Results  Component Value Date   WBC 7.8 02/21/2022   HGB 14.5 02/21/2022   HCT 43.4 02/21/2022   MCV 93 02/21/2022   PLT 220 02/21/2022   No results found for: "IRON", "TIBC", "FERRITIN" Adrian Behavioral Intervention:   Approximately 15 minutes were spent on the discussion below.  ASK: We discussed the diagnosis of Adrian with Martino today and Khristian agreed to give [place] permission to discuss Adrian behavioral modification therapy today.  ASSESS: Aman has the diagnosis of Adrian and his BMI today is 32.0. Tomi is in the action stage of change.   ADVISE: Jiovanny was educated on the multiple health risks of Adrian as well as the benefit of weight loss to improve his health. He was advised of the need for long term treatment and the importance of lifestyle modifications to improve his current health and to decrease his risk of future health problems.  AGREE: Multiple dietary modification options and treatment options were discussed and Even agreed to follow the recommendations documented in the above note.  ARRANGE: Kodie was educated on the importance of frequent visits to treat Adrian as outlined per CMS and USPSTF guidelines and agreed to schedule his next follow up appointment today.  Attestation Statements:   Reviewed by clinician on day of visit: allergies, medications, problem list, medical history, surgical history, family history, social history, and previous encounter notes.  Lenward Chancellor, CMA, am acting as transcriptionist for Coralie Common, MD This is the patient's first visit at [facility]. The patient's NEW PATIENT PACKET was reviewed at length. Included in the packet: current and past health history, medications, allergies, ROS, gynecologic history (women only), surgical history, family history, social history, weight history,  weight loss surgery history (for those that have had weight loss surgery), nutritional evaluation, mood and food questionnaire, PHQ9, Epworth questionnaire, sleep habits questionnaire, patient life and health improvement goals questionnaire. These will all be scanned into the patient's chart under media.   During the visit, I independently reviewed the patient's EKG, bioimpedance scale results, and indirect calorimeter results. I used this information to tailor a meal plan for the patient that will help him to lose weight and will improve his Adrian-related conditions going forward. I performed a medically necessary appropriate examination and/or  evaluation. I discussed the assessment and treatment plan with the patient. The patient was provided an opportunity to ask questions and all were answered. The patient agreed with the plan and demonstrated an understanding of the instructions. Labs were ordered at this visit and will be reviewed at the next visit unless more critical results need to be addressed immediately. Clinical information was updated and documented in the EMR.   Time spent on visit including pre-visit chart review and post-visit care was 42 minutes.     I have reviewed the above documentation for accuracy and completeness, and I agree with the above. - Coralie Common, MD

## 2022-03-03 DIAGNOSIS — M7542 Impingement syndrome of left shoulder: Secondary | ICD-10-CM | POA: Diagnosis not present

## 2022-03-03 DIAGNOSIS — M25512 Pain in left shoulder: Secondary | ICD-10-CM | POA: Diagnosis not present

## 2022-03-03 DIAGNOSIS — M7552 Bursitis of left shoulder: Secondary | ICD-10-CM | POA: Diagnosis not present

## 2022-03-07 ENCOUNTER — Ambulatory Visit (INDEPENDENT_AMBULATORY_CARE_PROVIDER_SITE_OTHER): Payer: PPO | Admitting: Family Medicine

## 2022-03-09 ENCOUNTER — Ambulatory Visit (INDEPENDENT_AMBULATORY_CARE_PROVIDER_SITE_OTHER): Payer: PPO | Admitting: Family Medicine

## 2022-03-15 DIAGNOSIS — M1990 Unspecified osteoarthritis, unspecified site: Secondary | ICD-10-CM | POA: Diagnosis not present

## 2022-03-15 DIAGNOSIS — M2550 Pain in unspecified joint: Secondary | ICD-10-CM | POA: Diagnosis not present

## 2022-03-15 DIAGNOSIS — M549 Dorsalgia, unspecified: Secondary | ICD-10-CM | POA: Diagnosis not present

## 2022-03-15 DIAGNOSIS — M1180 Other specified crystal arthropathies, unspecified site: Secondary | ICD-10-CM | POA: Diagnosis not present

## 2022-03-15 DIAGNOSIS — M25512 Pain in left shoulder: Secondary | ICD-10-CM | POA: Diagnosis not present

## 2022-03-16 DIAGNOSIS — M7552 Bursitis of left shoulder: Secondary | ICD-10-CM | POA: Diagnosis not present

## 2022-03-16 DIAGNOSIS — M25512 Pain in left shoulder: Secondary | ICD-10-CM | POA: Diagnosis not present

## 2022-03-16 DIAGNOSIS — M7542 Impingement syndrome of left shoulder: Secondary | ICD-10-CM | POA: Diagnosis not present

## 2022-03-20 DIAGNOSIS — G4733 Obstructive sleep apnea (adult) (pediatric): Secondary | ICD-10-CM | POA: Diagnosis not present

## 2022-03-28 DIAGNOSIS — M25512 Pain in left shoulder: Secondary | ICD-10-CM | POA: Diagnosis not present

## 2022-03-28 DIAGNOSIS — R972 Elevated prostate specific antigen [PSA]: Secondary | ICD-10-CM | POA: Diagnosis not present

## 2022-04-04 DIAGNOSIS — R3915 Urgency of urination: Secondary | ICD-10-CM | POA: Diagnosis not present

## 2022-04-04 DIAGNOSIS — R972 Elevated prostate specific antigen [PSA]: Secondary | ICD-10-CM | POA: Diagnosis not present

## 2022-04-04 DIAGNOSIS — N5201 Erectile dysfunction due to arterial insufficiency: Secondary | ICD-10-CM | POA: Diagnosis not present

## 2022-04-10 DIAGNOSIS — S43432D Superior glenoid labrum lesion of left shoulder, subsequent encounter: Secondary | ICD-10-CM | POA: Diagnosis not present

## 2022-04-10 DIAGNOSIS — M19012 Primary osteoarthritis, left shoulder: Secondary | ICD-10-CM | POA: Diagnosis not present

## 2022-04-10 DIAGNOSIS — M7552 Bursitis of left shoulder: Secondary | ICD-10-CM | POA: Diagnosis not present

## 2022-04-10 DIAGNOSIS — M75122 Complete rotator cuff tear or rupture of left shoulder, not specified as traumatic: Secondary | ICD-10-CM | POA: Diagnosis not present

## 2022-04-11 DIAGNOSIS — G4733 Obstructive sleep apnea (adult) (pediatric): Secondary | ICD-10-CM | POA: Diagnosis not present

## 2022-04-19 DIAGNOSIS — G4733 Obstructive sleep apnea (adult) (pediatric): Secondary | ICD-10-CM | POA: Diagnosis not present

## 2022-04-20 DIAGNOSIS — S46012A Strain of muscle(s) and tendon(s) of the rotator cuff of left shoulder, initial encounter: Secondary | ICD-10-CM | POA: Diagnosis not present

## 2022-04-20 DIAGNOSIS — M7542 Impingement syndrome of left shoulder: Secondary | ICD-10-CM | POA: Diagnosis not present

## 2022-04-20 DIAGNOSIS — M24112 Other articular cartilage disorders, left shoulder: Secondary | ICD-10-CM | POA: Diagnosis not present

## 2022-04-20 DIAGNOSIS — S46192A Other injury of muscle, fascia and tendon of long head of biceps, left arm, initial encounter: Secondary | ICD-10-CM | POA: Diagnosis not present

## 2022-04-20 DIAGNOSIS — G8918 Other acute postprocedural pain: Secondary | ICD-10-CM | POA: Diagnosis not present

## 2022-04-20 DIAGNOSIS — S46112A Strain of muscle, fascia and tendon of long head of biceps, left arm, initial encounter: Secondary | ICD-10-CM | POA: Diagnosis not present

## 2022-04-20 DIAGNOSIS — M19012 Primary osteoarthritis, left shoulder: Secondary | ICD-10-CM | POA: Diagnosis not present

## 2022-04-24 ENCOUNTER — Other Ambulatory Visit (HOSPITAL_COMMUNITY): Payer: Self-pay | Admitting: Physician Assistant

## 2022-04-24 ENCOUNTER — Ambulatory Visit (HOSPITAL_COMMUNITY)
Admission: RE | Admit: 2022-04-24 | Discharge: 2022-04-24 | Disposition: A | Discharge status: Home / Self Care | Payer: PPO | Source: Ambulatory Visit | Attending: Internal Medicine | Admitting: Internal Medicine

## 2022-04-24 DIAGNOSIS — M79602 Pain in left arm: Secondary | ICD-10-CM

## 2022-04-27 DIAGNOSIS — M25512 Pain in left shoulder: Secondary | ICD-10-CM | POA: Diagnosis not present

## 2022-05-02 DIAGNOSIS — M25512 Pain in left shoulder: Secondary | ICD-10-CM | POA: Diagnosis not present

## 2022-05-04 DIAGNOSIS — M25512 Pain in left shoulder: Secondary | ICD-10-CM | POA: Diagnosis not present

## 2022-05-04 DIAGNOSIS — Z4789 Encounter for other orthopedic aftercare: Secondary | ICD-10-CM | POA: Diagnosis not present

## 2022-05-09 DIAGNOSIS — M25512 Pain in left shoulder: Secondary | ICD-10-CM | POA: Diagnosis not present

## 2022-05-11 DIAGNOSIS — M25512 Pain in left shoulder: Secondary | ICD-10-CM | POA: Diagnosis not present

## 2022-05-15 DIAGNOSIS — M25512 Pain in left shoulder: Secondary | ICD-10-CM | POA: Diagnosis not present

## 2022-05-17 ENCOUNTER — Ambulatory Visit (HOSPITAL_COMMUNITY)
Admission: RE | Admit: 2022-05-17 | Discharge: 2022-05-17 | Disposition: A | Discharge status: Home / Self Care | Payer: PPO | Source: Ambulatory Visit | Attending: Internal Medicine | Admitting: Internal Medicine

## 2022-05-17 ENCOUNTER — Other Ambulatory Visit (HOSPITAL_BASED_OUTPATIENT_CLINIC_OR_DEPARTMENT_OTHER): Payer: Self-pay | Admitting: Internal Medicine

## 2022-05-17 DIAGNOSIS — M25512 Pain in left shoulder: Secondary | ICD-10-CM | POA: Diagnosis not present

## 2022-05-17 DIAGNOSIS — K5792 Diverticulitis of intestine, part unspecified, without perforation or abscess without bleeding: Secondary | ICD-10-CM | POA: Insufficient documentation

## 2022-05-17 DIAGNOSIS — N2889 Other specified disorders of kidney and ureter: Secondary | ICD-10-CM | POA: Diagnosis not present

## 2022-05-17 MED ORDER — IOHEXOL 9 MG/ML PO SOLN
500.0000 mL | ORAL | Status: AC
  Administered 2022-05-17 (×2): 500 mL via ORAL

## 2022-05-20 DIAGNOSIS — G4733 Obstructive sleep apnea (adult) (pediatric): Secondary | ICD-10-CM | POA: Diagnosis not present

## 2022-05-23 DIAGNOSIS — M25512 Pain in left shoulder: Secondary | ICD-10-CM | POA: Diagnosis not present

## 2022-05-25 DIAGNOSIS — M25512 Pain in left shoulder: Secondary | ICD-10-CM | POA: Diagnosis not present

## 2022-05-29 DIAGNOSIS — C61 Malignant neoplasm of prostate: Secondary | ICD-10-CM | POA: Diagnosis not present

## 2022-05-29 DIAGNOSIS — D075 Carcinoma in situ of prostate: Secondary | ICD-10-CM | POA: Diagnosis not present

## 2022-06-06 DIAGNOSIS — N5201 Erectile dysfunction due to arterial insufficiency: Secondary | ICD-10-CM | POA: Diagnosis not present

## 2022-06-06 DIAGNOSIS — R3915 Urgency of urination: Secondary | ICD-10-CM | POA: Diagnosis not present

## 2022-06-06 DIAGNOSIS — C61 Malignant neoplasm of prostate: Secondary | ICD-10-CM | POA: Diagnosis not present

## 2022-06-06 DIAGNOSIS — M25512 Pain in left shoulder: Secondary | ICD-10-CM | POA: Diagnosis not present

## 2022-06-06 DIAGNOSIS — R972 Elevated prostate specific antigen [PSA]: Secondary | ICD-10-CM | POA: Diagnosis not present

## 2022-06-08 DIAGNOSIS — M25512 Pain in left shoulder: Secondary | ICD-10-CM | POA: Diagnosis not present

## 2022-06-13 DIAGNOSIS — M25512 Pain in left shoulder: Secondary | ICD-10-CM | POA: Diagnosis not present

## 2022-06-13 NOTE — Progress Notes (Signed)
GU Location of Tumor / Histology: Prostate Ca  If Prostate Cancer, Gleason Score is (3 + 4) and PSA is (5.75 on 03/2022).  Biopsies      Past/Anticipated interventions by urology, if any:     Next appointment is in 6 weeks with Dr. Tresa Moore.    Past/Anticipated interventions by medical oncology, if any: NA  Weight changes, if any:  8 lbs due to intestinal virus.  IPSS:  9 SHIM:  5  Bowel/Bladder complaints, if any:  No  Nausea/Vomiting, if any: No  Pain issues, if any:  3/10 Left shoulder rotator cuff surgery.  SAFETY ISSUES: Prior radiation?  No Pacemaker/ICD? No Possible current pregnancy? Male Is the patient on methotrexate? No  Current Complaints / other details:  Need more information about treatment options

## 2022-06-15 DIAGNOSIS — F5104 Psychophysiologic insomnia: Secondary | ICD-10-CM | POA: Diagnosis not present

## 2022-06-15 DIAGNOSIS — R195 Other fecal abnormalities: Secondary | ICD-10-CM | POA: Diagnosis not present

## 2022-06-19 DIAGNOSIS — G4733 Obstructive sleep apnea (adult) (pediatric): Secondary | ICD-10-CM | POA: Diagnosis not present

## 2022-06-20 ENCOUNTER — Ambulatory Visit
Admission: RE | Admit: 2022-06-20 | Discharge: 2022-06-20 | Disposition: A | Discharge status: Home / Self Care | Payer: PPO | Source: Ambulatory Visit | Attending: Radiation Oncology | Admitting: Radiation Oncology

## 2022-06-20 ENCOUNTER — Other Ambulatory Visit: Payer: Self-pay

## 2022-06-20 VITALS — BP 131/79 | HR 77 | Temp 96.6°F | Resp 18 | Ht 70.0 in | Wt 229.4 lb

## 2022-06-20 DIAGNOSIS — G4730 Sleep apnea, unspecified: Secondary | ICD-10-CM | POA: Insufficient documentation

## 2022-06-20 DIAGNOSIS — Z79899 Other long term (current) drug therapy: Secondary | ICD-10-CM | POA: Diagnosis not present

## 2022-06-20 DIAGNOSIS — K219 Gastro-esophageal reflux disease without esophagitis: Secondary | ICD-10-CM | POA: Insufficient documentation

## 2022-06-20 DIAGNOSIS — E785 Hyperlipidemia, unspecified: Secondary | ICD-10-CM | POA: Insufficient documentation

## 2022-06-20 DIAGNOSIS — M25512 Pain in left shoulder: Secondary | ICD-10-CM | POA: Diagnosis not present

## 2022-06-20 DIAGNOSIS — I10 Essential (primary) hypertension: Secondary | ICD-10-CM | POA: Insufficient documentation

## 2022-06-20 DIAGNOSIS — C61 Malignant neoplasm of prostate: Secondary | ICD-10-CM | POA: Insufficient documentation

## 2022-06-20 DIAGNOSIS — G2581 Restless legs syndrome: Secondary | ICD-10-CM | POA: Diagnosis not present

## 2022-06-20 NOTE — Progress Notes (Signed)
Radiation Oncology         [phone] ________________________________  Initial Outpatient Consultation  Name: Adrian Skinner MRN: [id number]  Date: 06/20/2022  DOB: Oct 11, 1945  ID:POEUM, Thayer Jew, MD  Alexis Frock, MD   REFERRING PHYSICIAN: Alexis Frock, MD  DIAGNOSIS: 77 y.o. gentleman with Stage T1c adenocarcinoma of the prostate with Gleason score of 3+4, and PSA of 5.75.    ICD-10-CM   1. Malignant neoplasm of prostate ([place])  C61       HISTORY OF PRESENT ILLNESS: Adrian Skinner is a 76 y.o. male with a diagnosis of prostate cancer. He has been followed by urology for a history of erectile dysfunction and urinary symptoms. He has been on tamsulosin since 08/2017. His PSA fluctuated but remained stable at 4.5 in 03/2020, which is considered normal when for his age, and they discontinued PSA screening at that time. His PCP repeated a PSA in 09/2021, which showed further elevation to 7.8. He returned to Dr. Tresa Moore in 12/2021 for repeat PSA, which showed a drop to 5.3. The patient's digital rectal examination has remained normal.   A repeat PSA obtained in 03/2022 by Dr. Tresa Moore showed persistent elevation at 5.75. After discussion, the patient and Dr. Tresa Moore agreed to proceed with biopsy to rule out large volume or aggressive disease. The patient proceeded to transrectal ultrasound with 12 biopsies of the prostate on 05/29/22.  The prostate volume measured 38 cc.  Out of 12 core biopsies, 5 were positive.  The maximum Gleason score was 3+4, and this was seen in left base lateral and left base. Additionally, small foci of Gleason 3+3 were seen in left mid lateral, left mid, and right apex lateral.  The patient reviewed the biopsy results with his urologist and he has kindly been referred today for discussion of potential radiation treatment options.   PREVIOUS RADIATION THERAPY: No  PAST MEDICAL HISTORY:  Past Medical History:  Diagnosis Date   Anemia    Anxiety    Back pain    Back  pain    Bilateral swelling of feet    Cataract    Environmental and seasonal allergies    Essential hypertension    GERD (gastroesophageal reflux disease)    Headache    Hiatal hernia    High blood pressure    Hyperlipidemia    Insomnia    Migraine without status migrainosus, not intractable    OSA (obstructive sleep apnea) 11/25/2014   Reflux    RLS (restless legs syndrome)    Sleep apnea    Vertigo       PAST SURGICAL HISTORY: Past Surgical History:  Procedure Laterality Date   FOREARM SURGERY     LEFT HEART CATHETERIZATION WITH CORONARY ANGIOGRAM N/A 11/11/2013   Procedure: LEFT HEART CATHETERIZATION WITH CORONARY ANGIOGRAM;  Surgeon: Peter M Martinique, MD;  Location: [facility] C[facility] LAB;  Service: Cardiovascular;  Laterality: N/A;   NASAL SINUS SURGERY     ROTATOR CUFF REPAIR Right    right   TONSILLECTOMY AND ADENOIDECTOMY      FAMILY HISTORY:  Family History  Adopted: Yes  Family history unknown: Yes    SOCIAL HISTORY:  Social History   Socioeconomic History   Marital status: Married    Spouse name: Sayan Aldava   Number of children: 2   Years of education: Bachelors   Highest education level: Not on file  Occupational History   Occupation: Retired   Occupation: Scientist, [employer] of [employer]  Tobacco Use  Smoking status: Never   Smokeless tobacco: Never  Substance and Sexual Activity   Alcohol use: Yes    Alcohol/week: 0.0 standard drinks of alcohol    Comment: glass of wine (2-4oz) daily   Drug use: No   Sexual activity: Not on file  Other Topics Concern   Not on file  Social History Narrative   Retired.  Part time driver for [employer].  Lives at home with wife.       PT IS ADOPTED- NO MEDICAL HISTORY FOR FAMILY.      Right-handed.      2 cups caffeine per day.   Social Determinants of Health   Financial Resource Strain: Not on file  Food Insecurity: Not on file  Transportation Needs: Not on file  Physical Activity: Not on file  Stress: Not  on file  Social Connections: Not on file  Intimate Partner Violence: Not on file    ALLERGIES: Patient has no known allergies.  MEDICATIONS:  Current Outpatient Medications  Medication Sig Dispense Refill   Azelastine HCl 137 MCG/SPRAY SOLN Place into the nose.     Evolocumab (REPATHA SURECLICK) 287 MG/ML SOAJ      furosemide (LASIX) 20 MG tablet Take 1 tablet (20 mg total) by mouth daily as needed for edema. 30 tablet 3   omeprazole (PRILOSEC) 40 MG capsule Take by mouth.     pramipexole (MIRAPEX) 0.5 MG tablet Take by mouth. Take 2 at bedtime     sertraline (ZOLOFT) 25 MG tablet Take 25 mg by mouth daily.     tamsulosin (FLOMAX) 0.4 MG CAPS capsule      TRIAMTERENE PO Take 10 mg by mouth. 10 mg per pt questionaire     No current facility-administered medications for this encounter.    REVIEW OF SYSTEMS:  On review of systems, the patient reports that he is doing well overall. He denies any chest pain, shortness of breath, cough, fevers, chills, night sweats, unintended weight changes. He denies any bowel disturbances, and denies abdominal pain, nausea or vomiting. He denies any new musculoskeletal or joint aches or pains. His IPSS was 9, indicating mild urinary symptoms. His SHIM was 5, indicating he has severe erectile dysfunction. A complete review of systems is obtained and is otherwise negative.    PHYSICAL EXAM:  Wt Readings from Last 3 Encounters:  06/20/22 229 lb 6 oz (104 kg)  02/21/22 223 lb (101.2 kg)  02/10/21 230 lb 12.8 oz (104.7 kg)   Temp Readings from Last 3 Encounters:  06/20/22 (!) 96.6 F (35.9 C) (Temporal)  02/21/22 97.6 F (36.4 C)  05/27/20 97.7 F (36.5 C)   BP Readings from Last 3 Encounters:  06/20/22 131/79  02/21/22 116/73  02/10/21 130/82   Pulse Readings from Last 3 Encounters:  06/20/22 77  02/21/22 65  02/10/21 (!) 58   Pain Assessment Pain Score: 3  Pain Loc: Shoulder (Recent rotater cuff surgey in October.)/10  In general this  is a well appearing *** male in no acute distress. He's alert and oriented x4 and appropriate throughout the examination. Cardiopulmonary assessment is negative for acute distress, and he exhibits normal effort.     KPS = ***  100 - Normal; no complaints; no evidence of disease. 90   - Able to carry on normal activity; minor signs or symptoms of disease. 80   - Normal activity with effort; some signs or symptoms of disease. 46   - Cares for self; unable to carry on normal  activity or to do active work. 60   - Requires occasional assistance, but is able to care for most of his personal needs. 50   - Requires considerable assistance and frequent medical care. 28   - Disabled; requires special care and assistance. 56   - Severely disabled; hospital admission is indicated although death not imminent. 63   - Very sick; hospital admission necessary; active supportive treatment necessary. 10   - Moribund; fatal processes progressing rapidly. 0     - Dead  Karnofsky DA, Abelmann Lindsey, Craver LS and Burchenal [facility] [phone]) The use of the nitrogen mustards in the palliative treatment of carcinoma: with particular reference to bronchogenic carcinoma Cancer 1 634-56  LABORATORY DATA:  Lab Results  Component Value Date   WBC 7.8 02/21/2022   HGB 14.5 02/21/2022   HCT 43.4 02/21/2022   MCV 93 02/21/2022   PLT 220 02/21/2022   Lab Results  Component Value Date   NA 140 02/21/2022   K 3.8 02/21/2022   CL 99 02/21/2022   CO2 23 02/21/2022   Lab Results  Component Value Date   ALT 21 02/21/2022   AST 20 02/21/2022   ALKPHOS 95 02/21/2022   BILITOT 0.5 02/21/2022     RADIOGRAPHY: No results found.    IMPRESSION/PLAN: 1. 76 y.o. gentleman with Stage T1c adenocarcinoma of the prostate with Gleason Score of 3+4, and PSA of 5.75. We discussed the patient's workup and outlined the nature of prostate cancer in this setting. The patient's T stage, Gleason's score, and PSA put him into the favorable  intermediate risk group. Accordingly, he is eligible for a variety of potential treatment options including brachytherapy, 5.5 weeks of external radiation, or prostatectomy. We discussed the available radiation techniques, and focused on the details and logistics of delivery. We discussed and outlined the risks, benefits, short and long-term effects associated with radiotherapy and compared and contrasted these with prostatectomy. We discussed the role of SpaceOAR gel in reducing the rectal toxicity associated with radiotherapy. He appears to have a good understanding of his disease and our treatment recommendations which are of curative intent.  He was encouraged to ask questions that were answered to his stated satisfaction.  At the conclusion of our conversation, the patient is interested in moving forward with brachytherapy and use of SpaceOAR gel to reduce rectal toxicity from radiotherapy.  We will share our discussion with Dr. Tresa Moore and move forward with scheduling his [facility] planning appointment in the near future.  The patient met briefly with Romie Jumper in our office who will be working closely with him to coordinate OR scheduling and pre and post procedure appointments.  We will contact the pharmaceutical rep to ensure that [place] is available at the time of procedure.  We enjoyed meeting him today and look forward to continuing to participate in his care.   We personally spent *** minutes in this encounter including chart review, reviewing radiological studies, meeting face-to-face with the patient, entering orders and completing documentation.    Nicholos Johns, PA-C    Tyler Pita, MD  [facility] Direct Dial: [phone]  Fax: [phone] [email or web address]  Skype  [email or web address]   This document serves as a record of services personally performed by Tyler Pita, MD and Freeman Caldron, PA-C. It was created on their behalf by Wilburn Mylar, a  trained medical scribe. The creation of this record is based on the scribe's personal observations and the provider's statements to them. This  document has been checked and approved by the attending provider.

## 2022-06-21 DIAGNOSIS — C61 Malignant neoplasm of prostate: Secondary | ICD-10-CM | POA: Diagnosis not present

## 2022-06-21 DIAGNOSIS — Z191 Hormone sensitive malignancy status: Secondary | ICD-10-CM | POA: Diagnosis not present

## 2022-06-27 ENCOUNTER — Telehealth: Payer: Self-pay | Admitting: *Deleted

## 2022-06-27 NOTE — Telephone Encounter (Signed)
CALLED PATIENT TO UPDATE, SPOKE WITH PATIENT 

## 2022-06-29 DIAGNOSIS — M25512 Pain in left shoulder: Secondary | ICD-10-CM | POA: Diagnosis not present

## 2022-07-03 ENCOUNTER — Telehealth: Payer: Self-pay | Admitting: *Deleted

## 2022-07-03 NOTE — Telephone Encounter (Signed)
Called patient to inform of implant date of 08-11-22, lvm for a return call

## 2022-07-05 ENCOUNTER — Telehealth: Payer: Self-pay | Admitting: *Deleted

## 2022-07-05 NOTE — Progress Notes (Signed)
  Radiation Oncology         [phone]) [phone] ________________________________  Name: Adrian Skinner MRN: [id number]  Date: 07/06/2022  DOB: 13-Oct-1945  SIMULATION AND TREATMENT PLANNING NOTE PUBIC ARCH STUDY  WK:MQKMM, Thayer Jew, MD  Alexis Frock, MD  DIAGNOSIS: 77 y.o. gentleman with Stage T1c adenocarcinoma of the prostate with Gleason score of 3+4, and PSA of 5.75.   Oncology History  Malignant neoplasm of prostate ([place])  05/29/2022 Cancer Staging   Staging form: Prostate, AJCC 8th Edition - Clinical stage from 05/29/2022: Stage IIB (cT1c, cN0, cM0, PSA: 5.8, Grade Group: 2) - Signed by Freeman Caldron, PA-C on 06/20/2022 Histopathologic type: Adenocarcinoma, NOS Stage prefix: Initial diagnosis Prostate specific antigen (PSA) range: Less than 10 Gleason primary pattern: 3 Gleason secondary pattern: 4 Gleason score: 7 Histologic grading system: 5 grade system Number of biopsy cores examined: 12 Number of biopsy cores positive: 5 Location of positive needle core biopsies: Both sides   06/20/2022 Initial Diagnosis   Malignant neoplasm of prostate ([place])       ICD-10-CM   1. Malignant neoplasm of prostate ([place])  C61       COMPLEX SIMULATION:  The patient presented today for evaluation for possible prostate seed implant. He was brought to the radiation planning suite and placed supine on the CT couch. A 3-dimensional image study set was obtained in upload to the planning computer. There, on each axial slice, I contoured the prostate gland. Then, using three-dimensional radiation planning tools I reconstructed the prostate in view of the structures from the transperineal needle pathway to assess for possible pubic arch interference. In doing so, I did not appreciate any pubic arch interference. Also, the patient's prostate volume was estimated based on the drawn structure. The volume was 38 cc.  Given the pubic arch appearance and prostate volume, patient remains a good candidate to  proceed with prostate seed implant. Today, he freely provided informed written consent to proceed.    PLAN: The patient will undergo prostate seed implant.   ________________________________  Sheral Apley. Tammi Klippel, M.D.

## 2022-07-05 NOTE — Telephone Encounter (Signed)
CALLED PATIENT TO REMIND OF PRE-SEED APPTS. FOR 07-06-22, SPOKE WITH PATIENT AND HE IS AWARE OF THESE APPTS.

## 2022-07-05 NOTE — Progress Notes (Signed)
Radiation Oncology         [phone]) [id number] ________________________________  Outpatient Follow up- Pre-seed visit  Name: Adrian Skinner MRN: [id number]  Date: 07/06/2022  DOB: [birth year]  JI:RCVEL, Thayer Jew, MD  Alexis Frock, MD   REFERRING PHYSICIAN: Alexis Frock, MD  DIAGNOSIS: 77 y.o. gentleman with Stage T1c adenocarcinoma of the prostate with Gleason score of 3+4, and PSA of 5.75.     ICD-10-CM   1. Malignant neoplasm of prostate ([place])  C61       HISTORY OF PRESENT ILLNESS: Adrian Skinner is a 77 y.o. male with a diagnosis of prostate cancer. He has been followed by urology for a history of erectile dysfunction and LUTS and has been on tamsulosin since 08/2017. His PSA has fluctuated over the years, between 3-4.5, which is considered normal for his age, so they elected to discontinue PSA screening in 03/2020. However, on routine labs with his PCP in 09/2021, his PSA had further elevated to 7.8. He returned to Dr. Tresa Moore in 12/2021 for repeat PSA, which showed a decrease to 5.3. His DRE remained without concerning findings but his PSA had increased further to 5.75 when repeated in 03/2022. After discussion, the patient and Dr. Tresa Moore agreed to proceed with prostate biopsy to rule out large volume or aggressive disease. The patient proceeded to transrectal ultrasound with 12 biopsies of the prostate on 05/29/22.  The prostate volume measured 38 cc.  Out of 12 core biopsies, 5 were positive.  The maximum Gleason score was 3+4, and this was seen in the left base lateral and left base. Additionally, small foci of Gleason 3+3 were seen in the left mid lateral, left mid, and right apex lateral.   The patient reviewed the biopsy results with his urologist and was kindly referred to [place] for discussion of potential radiation treatment options. We initially met the patient on 06/20/22 and he was most interested in proceeding with brachytherapy and SpaceOAR gel placement for treatment of his disease. He is  here today for his pre-procedure imaging for planning and to answer any additional questions he may have about this treatment.   PREVIOUS RADIATION THERAPY: No  PAST MEDICAL HISTORY:  Past Medical History:  Diagnosis Date   Anemia    Anxiety    Back pain    Back pain    Bilateral swelling of feet    Cataract    Environmental and seasonal allergies    Essential hypertension    GERD (gastroesophageal reflux disease)    Headache    Hiatal hernia    High blood pressure    Hyperlipidemia    Insomnia    Migraine without status migrainosus, not intractable    OSA (obstructive sleep apnea) 11/25/2014   Reflux    RLS (restless legs syndrome)    Sleep apnea    Vertigo       PAST SURGICAL HISTORY: Past Surgical History:  Procedure Laterality Date   FOREARM SURGERY     LEFT HEART CATHETERIZATION WITH CORONARY ANGIOGRAM N/A 11/11/2013   Procedure: LEFT HEART CATHETERIZATION WITH CORONARY ANGIOGRAM;  Surgeon: Peter M Martinique, MD;  Location: [facility] CATH LAB;  Service: Cardiovascular;  Laterality: N/A;   NASAL SINUS SURGERY     ROTATOR CUFF REPAIR Right    right   TONSILLECTOMY AND ADENOIDECTOMY      FAMILY HISTORY:  Family History  Adopted: Yes  Family history unknown: Yes    SOCIAL HISTORY:  Social History   Socioeconomic History   Marital  status: Married    Spouse name: Terik Haughey   Number of children: 2   Years of education: Bachelors   Highest education level: Not on file  Occupational History   Occupation: Retired   Occupation: Scientist, [employer]  Tobacco Use   Smoking status: Never   Smokeless tobacco: Never  Substance and Sexual Activity   Alcohol use: Yes    Alcohol/week: 0.0 standard drinks of alcohol    Comment: glass of wine (2-4oz) daily   Drug use: No   Sexual activity: Not on file  Other Topics Concern   Not on file  Social History Narrative   Retired.  Part time driver for [employer].  Lives at home with wife.       PT IS ADOPTED- NO  MEDICAL HISTORY FOR FAMILY.      Right-handed.      2 cups caffeine per day.   Social Determinants of Health   Financial Resource Strain: Not on file  Food Insecurity: Not on file  Transportation Needs: Not on file  Physical Activity: Not on file  Stress: Not on file  Social Connections: Not on file  Intimate Partner Violence: Not on file    ALLERGIES: Patient has no known allergies.  MEDICATIONS:  Current Outpatient Medications  Medication Sig Dispense Refill   Azelastine HCl 137 MCG/SPRAY SOLN Place into the nose.     Evolocumab (REPATHA SURECLICK) 950 MG/ML SOAJ      furosemide (LASIX) 20 MG tablet Take 1 tablet (20 mg total) by mouth daily as needed for edema. 30 tablet 3   omeprazole (PRILOSEC) 40 MG capsule Take by mouth.     pramipexole (MIRAPEX) 0.5 MG tablet Take by mouth. Take 2 at bedtime     sertraline (ZOLOFT) 25 MG tablet Take 25 mg by mouth daily.     tamsulosin (FLOMAX) 0.4 MG CAPS capsule      TRIAMTERENE PO Take 10 mg by mouth. 10 mg per pt questionaire     No current facility-administered medications for this visit.    REVIEW OF SYSTEMS:   On review of systems, the patient reports that he is doing well overall. He denies any chest pain, shortness of breath, cough, fevers, chills, night sweats, or unintended weight changes. He denies any bowel disturbances, and denies abdominal pain, nausea or vomiting. He denies any new musculoskeletal or joint aches or pains. His IPSS was 9, indicating mild urinary symptoms that are manageable on Flomax daily. His SHIM was 5, indicating he has severe erectile dysfunction. A complete review of systems is obtained and is otherwise negative.     PHYSICAL EXAM:  Wt Readings from Last 3 Encounters:  06/20/22 229 lb 6 oz (104 kg)  02/21/22 223 lb (101.2 kg)  02/10/21 230 lb 12.8 oz (104.7 kg)   Temp Readings from Last 3 Encounters:  06/20/22 (!) 96.6 F (35.9 C) (Temporal)  02/21/22 97.6 F (36.4 C)  05/27/20 97.7 F  (36.5 C)   BP Readings from Last 3 Encounters:  06/20/22 131/79  02/21/22 116/73  02/10/21 130/82   Pulse Readings from Last 3 Encounters:  06/20/22 77  02/21/22 65  02/10/21 (!) 58    /10  In general this is a well appearing Caucasian male in no acute distress. He's alert and oriented x4 and appropriate throughout the examination. Cardiopulmonary assessment is negative for acute distress, and he exhibits normal effort.     KPS = 100  100 - Normal; no complaints;  no evidence of disease. 90   - Able to carry on normal activity; minor signs or symptoms of disease. 80   - Normal activity with effort; some signs or symptoms of disease. 36   - Cares for self; unable to carry on normal activity or to do active work. 60   - Requires occasional assistance, but is able to care for most of his personal needs. 50   - Requires considerable assistance and frequent medical care. 39   - Disabled; requires special care and assistance. 81   - Severely disabled; hospital admission is indicated although death not imminent. 58   - Very sick; hospital admission necessary; active supportive treatment necessary. 10   - Moribund; fatal processes progressing rapidly. 0     - Dead  Karnofsky DA, Abelmann Colton, Craver LS and Burchenal [facility] [phone]) The use of the nitrogen mustards in the palliative treatment of carcinoma: with particular reference to bronchogenic carcinoma Cancer 1 634-56  LABORATORY DATA:  Lab Results  Component Value Date   WBC 7.8 02/21/2022   HGB 14.5 02/21/2022   HCT 43.4 02/21/2022   MCV 93 02/21/2022   PLT 220 02/21/2022   Lab Results  Component Value Date   NA 140 02/21/2022   K 3.8 02/21/2022   CL 99 02/21/2022   CO2 23 02/21/2022   Lab Results  Component Value Date   ALT 21 02/21/2022   AST 20 02/21/2022   ALKPHOS 95 02/21/2022   BILITOT 0.5 02/21/2022     RADIOGRAPHY: No results found.    IMPRESSION/PLAN: 1. 77 y.o. gentleman with Stage T1c adenocarcinoma of  the prostate with Gleason score of 3+4, and PSA of 5.75.  The patient has elected to proceed with seed implant for treatment of his disease. We reviewed the risks, benefits, short and long-term effects associated with brachytherapy and discussed the role of SpaceOAR in reducing the rectal toxicity associated with radiotherapy.  He appears to have a good understanding of his disease and our treatment recommendations which are of curative intent.  He was encouraged to ask questions that were answered to his stated satisfaction. He has freely signed written consent to proceed today in the office and a copy of this document will be placed in his medical record. His procedure is tentatively scheduled for 08/11/22 in collaboration with Dr. Tresa Moore and we will see him back for his post-procedure visit approximately 3 weeks thereafter. We look forward to continuing to participate in his care. He knows that he is welcome to call with any questions or concerns at any time in the interim.  I personally spent 20 minutes in this encounter including chart review, reviewing radiological studies, meeting face-to-face with the patient, entering orders and completing documentation.    Nicholos Johns, MMS, PA-C [place] [facility] [place]: [phone]  Fax: [phone]

## 2022-07-06 ENCOUNTER — Encounter: Payer: Self-pay | Admitting: Urology

## 2022-07-06 ENCOUNTER — Ambulatory Visit
Admission: RE | Admit: 2022-07-06 | Discharge: 2022-07-06 | Disposition: A | Discharge status: Home / Self Care | Payer: PPO | Source: Ambulatory Visit | Attending: Radiation Oncology | Admitting: Radiation Oncology

## 2022-07-06 ENCOUNTER — Ambulatory Visit
Admission: RE | Admit: 2022-07-06 | Discharge: 2022-07-06 | Disposition: A | Discharge status: Home / Self Care | Payer: PPO | Source: Ambulatory Visit | Attending: Urology | Admitting: Urology

## 2022-07-06 VITALS — Ht 70.0 in | Wt 235.6 lb

## 2022-07-06 DIAGNOSIS — C61 Malignant neoplasm of prostate: Secondary | ICD-10-CM | POA: Insufficient documentation

## 2022-07-06 DIAGNOSIS — Z191 Hormone sensitive malignancy status: Secondary | ICD-10-CM | POA: Diagnosis not present

## 2022-07-06 NOTE — Progress Notes (Signed)
Pre-seed nursing interview for 77 y.o. gentleman with Stage T1c adenocarcinoma of the prostate with Gleason score of 3+4, and PSA of 5.75.  I verified patient's identity and began nursing interview. Patient is doing well. No related issues conveyed at this time.  Meaningful use complete. Tamsulosin as directed. Urology appt- 07/17/2022 w/ Dr. Tresa Moore at [facility]  Ht '5\' 10"'$  (1.778 m)   Wt 235 lb 9.6 oz (106.9 kg)   BMI 33.81 kg/m    This concludes the interview.   Leandra Kern, LPN

## 2022-07-11 ENCOUNTER — Other Ambulatory Visit: Payer: Self-pay | Admitting: Urology

## 2022-07-11 DIAGNOSIS — M25512 Pain in left shoulder: Secondary | ICD-10-CM | POA: Diagnosis not present

## 2022-07-13 DIAGNOSIS — M25512 Pain in left shoulder: Secondary | ICD-10-CM | POA: Diagnosis not present

## 2022-07-17 DIAGNOSIS — C61 Malignant neoplasm of prostate: Secondary | ICD-10-CM | POA: Diagnosis not present

## 2022-07-17 DIAGNOSIS — N5201 Erectile dysfunction due to arterial insufficiency: Secondary | ICD-10-CM | POA: Diagnosis not present

## 2022-07-17 DIAGNOSIS — R3915 Urgency of urination: Secondary | ICD-10-CM | POA: Diagnosis not present

## 2022-07-20 DIAGNOSIS — D2261 Melanocytic nevi of right upper limb, including shoulder: Secondary | ICD-10-CM | POA: Diagnosis not present

## 2022-07-20 DIAGNOSIS — L821 Other seborrheic keratosis: Secondary | ICD-10-CM | POA: Diagnosis not present

## 2022-07-20 DIAGNOSIS — L814 Other melanin hyperpigmentation: Secondary | ICD-10-CM | POA: Diagnosis not present

## 2022-07-20 DIAGNOSIS — Z85828 Personal history of other malignant neoplasm of skin: Secondary | ICD-10-CM | POA: Diagnosis not present

## 2022-07-20 DIAGNOSIS — D2272 Melanocytic nevi of left lower limb, including hip: Secondary | ICD-10-CM | POA: Diagnosis not present

## 2022-07-20 DIAGNOSIS — D2262 Melanocytic nevi of left upper limb, including shoulder: Secondary | ICD-10-CM | POA: Diagnosis not present

## 2022-07-20 DIAGNOSIS — L570 Actinic keratosis: Secondary | ICD-10-CM | POA: Diagnosis not present

## 2022-07-20 DIAGNOSIS — G4733 Obstructive sleep apnea (adult) (pediatric): Secondary | ICD-10-CM | POA: Diagnosis not present

## 2022-07-20 DIAGNOSIS — D171 Benign lipomatous neoplasm of skin and subcutaneous tissue of trunk: Secondary | ICD-10-CM | POA: Diagnosis not present

## 2022-07-20 DIAGNOSIS — L905 Scar conditions and fibrosis of skin: Secondary | ICD-10-CM | POA: Diagnosis not present

## 2022-07-20 DIAGNOSIS — D225 Melanocytic nevi of trunk: Secondary | ICD-10-CM | POA: Diagnosis not present

## 2022-07-20 DIAGNOSIS — D2271 Melanocytic nevi of right lower limb, including hip: Secondary | ICD-10-CM | POA: Diagnosis not present

## 2022-07-31 ENCOUNTER — Telehealth: Payer: Self-pay | Admitting: *Deleted

## 2022-07-31 NOTE — Telephone Encounter (Signed)
RETURNED PATIENT'S PHONE CALL, LVM FOR A RETURN CALL 

## 2022-08-01 ENCOUNTER — Encounter (HOSPITAL_BASED_OUTPATIENT_CLINIC_OR_DEPARTMENT_OTHER): Payer: Self-pay | Admitting: Urology

## 2022-08-01 NOTE — Progress Notes (Addendum)
Spoke w/ via phone for pre-op interview--- Adrian Skinner Lab needs dos---- ISTAT              Lab results------ Current EKG dated 01/2022 in [place]. COVID test -----patient states asymptomatic no test needed Arrive at -------0730 NPO after MN NO Solid Food.   Med rec completed Medications to take morning of surgery -----Prilosec and Zoloft Diabetic medication ----- Patient instructed no nail polish to be worn day of surgery Patient instructed to bring photo id and insurance card day of surgery Patient aware to have Driver (ride ) / caregiver wife Adrian Skinner   for 24 hours after surgery  Patient Special Instructions ----- Pre-Op special Istructions ----- FLEETS enema per surgeon AM of procedure, pt verbalized understanding. Patient verbalized understanding of instructions that were given at this phone interview. Patient denies shortness of breath, chest pain, fever, cough at this phone interview.

## 2022-08-10 ENCOUNTER — Telehealth: Payer: Self-pay | Admitting: *Deleted

## 2022-08-10 NOTE — Anesthesia Preprocedure Evaluation (Addendum)
Anesthesia Evaluation  Patient identified by MRN, date of birth, ID band Patient awake    Reviewed: Allergy & Precautions, NPO status , Patient's Chart, lab work & pertinent test results  History of Anesthesia Complications Negative for: history of anesthetic complications  Airway Mallampati: II  TM Distance: >3 FB Neck ROM: Full    Dental no notable dental hx.    Pulmonary sleep apnea    Pulmonary exam normal        Cardiovascular hypertension, + CAD  Normal cardiovascular exam  TTE 01/2021: EF 50-55%, mild LVH, grade I DD, mild MR, borderline dilatation of aortic  root measuring 14m, mild dilatation of ascending aorta measuring 336m   Neuro/Psych  Headaches  Anxiety     RLS    GI/Hepatic Neg liver ROS, hiatal hernia,GERD  Medicated,,  Endo/Other  negative endocrine ROS    Renal/GU negative Renal ROS  negative genitourinary   Musculoskeletal negative musculoskeletal ROS (+)    Abdominal   Peds  Hematology negative hematology ROS (+)   Anesthesia Other Findings Day of surgery medications reviewed with patient.  Reproductive/Obstetrics negative OB ROS                             Anesthesia Physical Anesthesia Plan  ASA: 2  Anesthesia Plan: General   Post-op Pain Management: Tylenol PO (pre-op)*   Induction: Intravenous  PONV Risk Score and Plan: 2 and Treatment may vary due to age or medical condition, Dexamethasone and Ondansetron  Airway Management Planned: Oral ETT  Additional Equipment: None  Intra-op Plan:   Post-operative Plan: Extubation in OR  Informed Consent: I have reviewed the patients History and Physical, chart, labs and discussed the procedure including the risks, benefits and alternatives for the proposed anesthesia with the patient or authorized representative who has indicated his/her understanding and acceptance.     Dental advisory given  Plan  Discussed with: CRNA  Anesthesia Plan Comments:        Anesthesia Quick Evaluation

## 2022-08-10 NOTE — Telephone Encounter (Signed)
CALLED PATIENT TO REMIND OF PROCEDURE FOR 08-11-22, SPOKE WITH PATIENT AND HE IS AWARE OF THIS PROCEDURE

## 2022-08-11 ENCOUNTER — Ambulatory Visit (HOSPITAL_COMMUNITY): Payer: PPO

## 2022-08-11 ENCOUNTER — Ambulatory Visit (HOSPITAL_BASED_OUTPATIENT_CLINIC_OR_DEPARTMENT_OTHER)
Admission: RE | Admit: 2022-08-11 | Discharge: 2022-08-11 | Disposition: A | Discharge status: Home / Self Care | Payer: PPO | Attending: Urology | Admitting: Urology

## 2022-08-11 ENCOUNTER — Encounter (HOSPITAL_BASED_OUTPATIENT_CLINIC_OR_DEPARTMENT_OTHER)
Admission: RE | Disposition: A | Discharge status: Home / Self Care | Payer: Self-pay | Source: Home / Self Care | Attending: Urology

## 2022-08-11 ENCOUNTER — Ambulatory Visit (HOSPITAL_BASED_OUTPATIENT_CLINIC_OR_DEPARTMENT_OTHER): Payer: PPO | Admitting: Anesthesiology

## 2022-08-11 ENCOUNTER — Other Ambulatory Visit: Payer: Self-pay

## 2022-08-11 ENCOUNTER — Encounter (HOSPITAL_BASED_OUTPATIENT_CLINIC_OR_DEPARTMENT_OTHER): Payer: Self-pay | Admitting: Urology

## 2022-08-11 DIAGNOSIS — K219 Gastro-esophageal reflux disease without esophagitis: Secondary | ICD-10-CM | POA: Insufficient documentation

## 2022-08-11 DIAGNOSIS — I10 Essential (primary) hypertension: Secondary | ICD-10-CM | POA: Diagnosis not present

## 2022-08-11 DIAGNOSIS — C61 Malignant neoplasm of prostate: Secondary | ICD-10-CM | POA: Diagnosis not present

## 2022-08-11 DIAGNOSIS — Z191 Hormone sensitive malignancy status: Secondary | ICD-10-CM | POA: Diagnosis not present

## 2022-08-11 DIAGNOSIS — F419 Anxiety disorder, unspecified: Secondary | ICD-10-CM | POA: Insufficient documentation

## 2022-08-11 DIAGNOSIS — K449 Diaphragmatic hernia without obstruction or gangrene: Secondary | ICD-10-CM | POA: Insufficient documentation

## 2022-08-11 DIAGNOSIS — G4730 Sleep apnea, unspecified: Secondary | ICD-10-CM | POA: Insufficient documentation

## 2022-08-11 HISTORY — PX: SPACE OAR INSTILLATION: SHX6769

## 2022-08-11 HISTORY — PX: RADIOACTIVE SEED IMPLANT: SHX5150

## 2022-08-11 LAB — POCT I-STAT, CHEM 8
BUN: 27 mg/dL — ABNORMAL HIGH (ref 8–23)
Calcium, Ion: 1.2 mmol/L (ref 1.15–1.40)
Chloride: 103 mmol/L (ref 98–111)
Creatinine, Ser: 1.3 mg/dL — ABNORMAL HIGH (ref 0.61–1.24)
Glucose, Bld: 104 mg/dL — ABNORMAL HIGH (ref 70–99)
HCT: 40 % (ref 39.0–52.0)
Hemoglobin: 13.6 g/dL (ref 13.0–17.0)
Potassium: 3.7 mmol/L (ref 3.5–5.1)
Sodium: 141 mmol/L (ref 135–145)
TCO2: 25 mmol/L (ref 22–32)

## 2022-08-11 SURGERY — INSERTION, RADIATION SOURCE, PROSTATE
Anesthesia: General | Site: Prostate

## 2022-08-11 MED ORDER — CEFAZOLIN SODIUM-DEXTROSE 2-4 GM/100ML-% IV SOLN
2.0000 g | Freq: Once | INTRAVENOUS | Status: AC
  Administered 2022-08-11: 2 g via INTRAVENOUS

## 2022-08-11 MED ORDER — SUGAMMADEX SODIUM 200 MG/2ML IV SOLN
INTRAVENOUS | Status: DC | PRN
  Administered 2022-08-11: 200 mg via INTRAVENOUS

## 2022-08-11 MED ORDER — SENNOSIDES-DOCUSATE SODIUM 8.6-50 MG PO TABS: 1.0000 | ORAL_TABLET | Freq: Two times a day (BID) | ORAL | 0 refills | Status: DC

## 2022-08-11 MED ORDER — OXYCODONE HCL 5 MG PO TABS: 5.0000 mg | ORAL_TABLET | Freq: Once | ORAL | Status: DC | PRN

## 2022-08-11 MED ORDER — FENTANYL CITRATE (PF) 100 MCG/2ML IJ SOLN
INTRAMUSCULAR | Status: AC
  Filled 2022-08-11: qty 2

## 2022-08-11 MED ORDER — FENTANYL CITRATE (PF) 100 MCG/2ML IJ SOLN
INTRAMUSCULAR | Status: DC | PRN
  Administered 2022-08-11 (×2): 25 ug via INTRAVENOUS
  Administered 2022-08-11 (×2): 50 ug via INTRAVENOUS
  Administered 2022-08-11 (×2): 25 ug via INTRAVENOUS

## 2022-08-11 MED ORDER — PROPOFOL 10 MG/ML IV BOLUS
INTRAVENOUS | Status: AC
  Filled 2022-08-11: qty 20

## 2022-08-11 MED ORDER — ONDANSETRON HCL 4 MG/2ML IJ SOLN
INTRAMUSCULAR | Status: AC
  Filled 2022-08-11: qty 2

## 2022-08-11 MED ORDER — FENTANYL CITRATE (PF) 100 MCG/2ML IJ SOLN: 25.0000 ug | INTRAMUSCULAR | Status: DC | PRN

## 2022-08-11 MED ORDER — ACETAMINOPHEN 500 MG PO TABS
ORAL_TABLET | ORAL | Status: AC
  Filled 2022-08-11: qty 2

## 2022-08-11 MED ORDER — SODIUM CHLORIDE 0.9 % IR SOLN
Status: DC | PRN
  Administered 2022-08-11: 1000 mL

## 2022-08-11 MED ORDER — ROCURONIUM BROMIDE 100 MG/10ML IV SOLN
INTRAVENOUS | Status: DC | PRN
  Administered 2022-08-11: 60 mg via INTRAVENOUS

## 2022-08-11 MED ORDER — FLEET ENEMA 7-19 GM/118ML RE ENEM: 1.0000 | ENEMA | Freq: Once | RECTAL | Status: DC

## 2022-08-11 MED ORDER — EPHEDRINE 5 MG/ML INJ
INTRAVENOUS | Status: AC
  Filled 2022-08-11: qty 5

## 2022-08-11 MED ORDER — SODIUM CHLORIDE (PF) 0.9 % IJ SOLN
INTRAMUSCULAR | Status: DC | PRN
  Administered 2022-08-11: 10 mL

## 2022-08-11 MED ORDER — DEXAMETHASONE SODIUM PHOSPHATE 4 MG/ML IJ SOLN
INTRAMUSCULAR | Status: DC | PRN
  Administered 2022-08-11: 5 mg via INTRAVENOUS

## 2022-08-11 MED ORDER — EPHEDRINE SULFATE (PRESSORS) 50 MG/ML IJ SOLN
INTRAMUSCULAR | Status: DC | PRN
  Administered 2022-08-11 (×2): 10 mg via INTRAVENOUS

## 2022-08-11 MED ORDER — PROPOFOL 10 MG/ML IV BOLUS
INTRAVENOUS | Status: DC | PRN
  Administered 2022-08-11: 150 mg via INTRAVENOUS

## 2022-08-11 MED ORDER — ACETAMINOPHEN 500 MG PO TABS
1000.0000 mg | ORAL_TABLET | Freq: Once | ORAL | Status: AC
  Administered 2022-08-11: 1000 mg via ORAL

## 2022-08-11 MED ORDER — IOHEXOL 300 MG/ML  SOLN
INTRAMUSCULAR | Status: DC | PRN
  Administered 2022-08-11: 7 mL

## 2022-08-11 MED ORDER — ONDANSETRON HCL 4 MG/2ML IJ SOLN
INTRAMUSCULAR | Status: DC | PRN
  Administered 2022-08-11: 4 mg via INTRAVENOUS

## 2022-08-11 MED ORDER — CEFAZOLIN SODIUM-DEXTROSE 2-4 GM/100ML-% IV SOLN
INTRAVENOUS | Status: AC
  Filled 2022-08-11: qty 100

## 2022-08-11 MED ORDER — OXYCODONE HCL 5 MG/5ML PO SOLN: 5.0000 mg | Freq: Once | ORAL | Status: DC | PRN

## 2022-08-11 MED ORDER — LIDOCAINE HCL (CARDIAC) PF 100 MG/5ML IV SOSY
PREFILLED_SYRINGE | INTRAVENOUS | Status: DC | PRN
  Administered 2022-08-11: 100 mg via INTRAVENOUS

## 2022-08-11 MED ORDER — ROCURONIUM BROMIDE 10 MG/ML (PF) SYRINGE
PREFILLED_SYRINGE | INTRAVENOUS | Status: AC
  Filled 2022-08-11: qty 10

## 2022-08-11 MED ORDER — TRAMADOL HCL 50 MG PO TABS: 50.0000 mg | ORAL_TABLET | Freq: Four times a day (QID) | ORAL | 0 refills | Status: DC | PRN

## 2022-08-11 MED ORDER — LACTATED RINGERS IV SOLN
INTRAVENOUS | Status: DC
  Administered 2022-08-11: 1000 mL via INTRAVENOUS

## 2022-08-11 MED ORDER — STERILE WATER FOR IRRIGATION IR SOLN
Status: DC | PRN
  Administered 2022-08-11: 500 mL

## 2022-08-11 SURGICAL SUPPLY — 41 items
BAG DRN RND TRDRP ANRFLXCHMBR (UROLOGICAL SUPPLIES) ×1
BAG URINE DRAIN 2000ML AR STRL (UROLOGICAL SUPPLIES) ×2 IMPLANT
BLADE CLIPPER SENSICLIP SURGIC (BLADE) ×2 IMPLANT
CATH FOLEY 2WAY SLVR  5CC 16FR (CATHETERS) ×1
CATH FOLEY 2WAY SLVR 5CC 16FR (CATHETERS) ×2 IMPLANT
CATH ROBINSON RED A/P 20FR (CATHETERS) ×2 IMPLANT
CLOTH BEACON ORANGE TIMEOUT ST (SAFETY) ×2 IMPLANT
CNTNR URN SCR LID CUP LEK RST (MISCELLANEOUS) ×2 IMPLANT
CONT SPEC 4OZ STRL OR WHT (MISCELLANEOUS) ×1
COVER BACK TABLE 60X90IN (DRAPES) ×2 IMPLANT
COVER MAYO STAND STRL (DRAPES) ×2 IMPLANT
DRSG TEGADERM 4X4.75 (GAUZE/BANDAGES/DRESSINGS) ×2 IMPLANT
DRSG TEGADERM 8X12 (GAUZE/BANDAGES/DRESSINGS) ×2 IMPLANT
GAUZE SPONGE 4X4 12PLY STRL (GAUZE/BANDAGES/DRESSINGS) IMPLANT
GEL ULTRASOUND 20GR AQUASONIC (MISCELLANEOUS) ×4 IMPLANT
GLOVE BIO SURGEON STRL SZ7.5 (GLOVE) ×2 IMPLANT
GLOVE BIOGEL PI IND STRL 6.5 (GLOVE) IMPLANT
GLOVE BIOGEL PI IND STRL 7.0 (GLOVE) IMPLANT
GLOVE SURG ORTHO 8.5 STRL (GLOVE) ×2 IMPLANT
GOWN STRL REUS W/ TWL LRG LVL3 (GOWN DISPOSABLE) IMPLANT
GOWN STRL REUS W/TWL LRG LVL3 (GOWN DISPOSABLE) ×3 IMPLANT
GRID BRACH TEMP 18GA 2.8X3X.75 (MISCELLANEOUS) ×2 IMPLANT
HOLDER FOLEY CATH W/STRAP (MISCELLANEOUS) IMPLANT
I-125 Seeds IMPLANT
IMPL SPACEOAR VUE SYSTEM (Spacer) ×2 IMPLANT
IMPLANT SPACEOAR VUE SYSTEM (Spacer) ×1 IMPLANT
IV NS 1000ML (IV SOLUTION) ×1
IV NS 1000ML BAXH (IV SOLUTION) ×2 IMPLANT
KIT TURNOVER CYSTO (KITS) ×2 IMPLANT
NDL BRACHY 18G 5PK (NEEDLE) ×8 IMPLANT
NDL PK MORGANSTERN STABILIZ (NEEDLE) ×2 IMPLANT
NEEDLE BRACHY 18G 5PK (NEEDLE) ×4 IMPLANT
NEEDLE PK MORGANSTERN STABILIZ (NEEDLE) ×1 IMPLANT
PACK CYSTO (CUSTOM PROCEDURE TRAY) ×2 IMPLANT
SHEATH ULTRASOUND LTX NONSTRL (SHEATH) IMPLANT
SLEEVE SCD COMPRESS KNEE MED (STOCKING) ×2 IMPLANT
SYR 10ML LL (SYRINGE) ×2 IMPLANT
SYR CONTROL 10ML LL (SYRINGE) ×2 IMPLANT
TOWEL OR 17X26 10 PK STRL BLUE (TOWEL DISPOSABLE) ×2 IMPLANT
UNDERPAD 30X36 HEAVY ABSORB (UNDERPADS AND DIAPERS) ×4 IMPLANT
WATER STERILE IRR 500ML POUR (IV SOLUTION) ×2 IMPLANT

## 2022-08-11 NOTE — H&P (Signed)
Adrian Skinner is an 77 y.o. male.    Chief Complaint: Pre-Op Prostate Brachytherapy Seed Implantation and SPACE-OAR  HPI:   1 - Favorable Moderate Risk Prostate Cancer - 5/12 cores up to 50% mix Grade 1 and 2 cancer 04/2022 on eval rising PSA to 5.7. TRUS 51m, No median. Scheduled for pimary brachytherapy 07/2022.   2 - Lower Urinary Tract Symptoms - progressive bother form urgency / frequncy, worse after increasing fluid intake as part of diet regimen. DRE 08/2017 40gm. PVR 08/2017 "435m (normal). Started tamsulosin trial 08/2017 and reports dramatic response, meeting his goals at BID.   PMH sig for HTN, Anxiety (SSRI). [facility]   Today [name] seen to proceed with prostate brachytherapy seed implantation and SPACE-OAR placement. No interval fevers.  Most recent UA withtou infectious parameters.   Past Medical History:  Diagnosis Date   Anemia    Anxiety    Back pain    Back pain    Bilateral swelling of feet    Cataract    Environmental and seasonal allergies    Essential hypertension    GERD (gastroesophageal reflux disease)    Headache    Hiatal hernia    High blood pressure    Hyperlipidemia    Insomnia    Migraine without status migrainosus, not intractable    OSA (obstructive sleep apnea) 11/25/2014   Reflux    RLS (restless legs syndrome)    Sleep apnea    Vertigo     Past Surgical History:  Procedure Laterality Date   FOREARM SURGERY     LEFT HEART CATHETERIZATION WITH CORONARY ANGIOGRAM N/A 11/11/2013   Procedure: LEFT HEART CATHETERIZATION WITH CORONARY ANGIOGRAM;  Surgeon: Peter M JoMartiniqueMD;  [facility];  Service: Cardiovascular;  Laterality: N/A;   NASAL SINUS SURGERY     ROTATOR CUFF REPAIR Right    right   TONSILLECTOMY AND ADENOIDECTOMY      Family History  Adopted: Yes  Family history unknown: Yes   Social History:  reports that he has never smoked. He has never used smokeless  tobacco. He reports current alcohol use. He reports that he does not use drugs.  Allergies: No Known Allergies  No medications prior to admission.    No results found for this or any previous visit (from the past 48 hour(s)). No results found.  Review of Systems  Constitutional:  Negative for chills and fever.  Genitourinary:  Positive for urgency.  All other systems reviewed and are negative.   Height 5' 10"$  (1.778 m), weight 90.7 kg. Physical Exam Vitals reviewed.  Constitutional:      Comments: Very vigorous for age, at baseline.   HENT:     Head: Normocephalic.  Eyes:     Pupils: Pupils are equal, round, and reactive to light.  Cardiovascular:     Rate and Rhythm: Normal rate.  Pulmonary:     Effort: Pulmonary effort is normal.  Abdominal:     General: Abdomen is flat.  Genitourinary:    Comments: No CVAT at present Musculoskeletal:        General: Normal range of motion.     Cervical back: Normal range of motion.  Neurological:     General: No focal deficit present.     Mental Status: He is alert.  Psychiatric:        Mood and Affect: Mood normal.      Assessment/Plan  Proceed  as planned with prostate brachytherapy seed implant / space-oar placement. Risks,benefits, alterantives, expected peri-op course discussed extensively previously and reiterated today.   Alexis Frock, MD 08/11/2022, 5:33 AM

## 2022-08-11 NOTE — Anesthesia Procedure Notes (Signed)
Procedure Name: Intubation Date/Time: 08/11/2022 9:40 AM  Performed by: Justice Rocher, CRNAPre-anesthesia Checklist: Patient identified, Emergency Drugs available, Suction available, Patient being monitored and Timeout performed Patient Re-evaluated:Patient Re-evaluated prior to induction Oxygen Delivery Method: Circle system utilized Preoxygenation: Pre-oxygenation with 100% oxygen Induction Type: IV induction Ventilation: Mask ventilation without difficulty Laryngoscope Size: Mac and 4 Grade View: Grade II Tube type: Oral Tube size: 7.5 mm Number of attempts: 1 Airway Equipment and Method: Stylet and Oral airway Placement Confirmation: ETT inserted through vocal cords under direct vision, positive ETCO2, breath sounds checked- equal and bilateral and CO2 detector Secured at: 23 cm Tube secured with: Tape Dental Injury: Teeth and Oropharynx as per pre-operative assessment

## 2022-08-11 NOTE — Discharge Instructions (Addendum)
1 - You may have urinary urgency (bladder spasms) and bloody urine on / off  for up to 2 weeks. This is normal.  2 - Call MD or go to ER for fever >102, severe pain / nausea / vomiting not relieved by medications, or acute change in medical status         No acetaminophen/Tylenol until after 2:00 pm today if needed.    Post Anesthesia Home Care Instructions  Activity: Get plenty of rest for the remainder of the day. A responsible individual must stay with you for 24 hours following the procedure.  For the next 24 hours, DO NOT: -Drive a car -Paediatric nurse -Drink alcoholic beverages -Take any medication unless instructed by your physician -Make any legal decisions or sign important papers.  Meals: Start with liquid foods such as gelatin or soup. Progress to regular foods as tolerated. Avoid greasy, spicy, heavy foods. If nausea and/or vomiting occur, drink only clear liquids until the nausea and/or vomiting subsides. Call your physician if vomiting continues.  Special Instructions/Symptoms: Your throat may feel dry or sore from the anesthesia or the breathing tube placed in your throat during surgery. If this causes discomfort, gargle with warm salt water. The discomfort should disappear within 24 hours.        PROSTATE CANCER TREATMENT WITH RADIOACTIVE IODINE-125 SEED IMPLANT  This instruction sheet is intended to discuss implantation of Iodine-125 seeds as treatment for cancer of the prostate. It will explain in detail what you may expect from this treatment and what precautions are necessary as a result of the treatment. Iodine-125 emits a relatively low energy radiation. The radioactive seeds are surgically implanted directly into the prostate gland. Most of the radiation is contained within the prostate gland. A very small amount is present outside the body.The precautions that we ask you to take are to ensure that those around you are protected from unnecessary  radiation. The principles of radiation safety that you need to understand are:  DISTANCE: The further a person is from the radioactive implant the less radiation they will be receiving. The amount of radiation received falls off quite rapidly with distance. More specific guidelines are given in the table on the last page.  TIME: The amount of radiation a person is exposed to is directly proportional to the amount of time that is spent in close proximity to the radioactive implant. Very little radiation will be received during short periods. See the table on the last page for more specific guideline.  CHILDREN UNDER AGE 82 Children should not be allowed to sit on your lap or otherwise be in very close contact for more than a few minutes for the first 6-8 weeks following the implant. You may affectionately greet (hug/kiss) a child for a short period of time, but remember, the longer you are in close proximity with that child the more radiation they are being exposed to. At a distance of 6 feet there is no limit to the length of time you may spend together. See specific guidelines on the last page.  PREGNANT OR POSSIBLY PREGNANT WOMEN Pregnant women should avoid prolonged close physical contact with you for the first 6-8 weeks after implant. At a distance of 6 feet there is no limit to the length of time you may spend together. Pregnant women or possibly pregnant women can safely be in close contact with you for a limited period of time. See the last page for guidelines.  FAMILY RELATIONS You may sleep  in the same bed as your partner (provided she is not pregnant or under the age of 41). Sexual intercourse, using a condom, may be resumed 2 weeks after the implant. Your semen may be discolored, dark brown or black. This is normal and is the result of bleeding that may have occurred during the implant. After 3-4 weeks it will not be necessary to use a condom.  DAILY ACTIVITIES You may resume normal  activities in a few days (example: work, shopping, church) without the risk of harmful radiation exposure to those around you provided you keep in mind the time and distance precautions. Objects that you touch or item that you use do not become radioactive. Linens, clothing, tableware, and dishes may be used by other persons without special precautions. Your bodily wastes (urine and stool) are not radioactive.  SPECIAL PRECAUTIONS It is possible to lose implanted Iodine-125 seed(s) through urination. Although it is possible to pass seeds indefinitely, it is most likely to occur immediately after catheter removal. To prevent this from happening the catheter that was in place during the implant procedure is removed immediately after the implant and a cystoscopy procedure is performed. The process of removing the catheter and the cystoscopy procedure should dislodge and remove any seeds that are not firmly imbedded in the prostate tissue. However, you should watch for seeds if/when you remove your catheter at home. The seeds are silver colored and the size of a grain of rice. In the unlikely event that a seed is seen after urination, simply flush the seed down the toilet. The seed should not be handled with your fingers, not even with a glove or napkin. A spoon or tweezers can be used to pick up a seed. The Radiation Oncology department is open Monday - Friday from 8:00 am to 5:30 pm with a Radiation Oncologist on call at all times. He or she may be reached by calling [phone]. If you are to be hospitalized or if death should occur, your family should notify the Runner, broadcasting/film/video.  SIDE EFFECTS There are very few side effects associate with the implant procedure. Minor burning with urination, weak stream, hesitancy, intermittency, frequency, mild pain or feeling unable to pass your urine freely are common and usually stop in one to four months. If these symptoms are extremely uncomfortable, contact  your physician.  RADIATION SAFETY GUIDELINES PROSTATE CANCER TREATMENT WITH RADIOACTIVE IODINE-125 SEED IMPLANT  The following guidelines will limit exposure to less than naturally occurring background radiation.  PERSONS AGE 58-45 (if able to become pregnant)  FOR 8 WEEKS FOLLOWING IMPLANT  At a distance of 1 foot: limit time to less than 2 hours/week At a distance of 3 feet: limit time to 20 hours/week At a distance of 6 feet: no restrictions  AFTER 8 WEEKS No restrictions  CHILDREN UNDER AGE 58, PREGNANT WOMEN OR POSSIBLY PREGNANT WOMEN  FOR 8 WEEKS FOLLOWING IMPLANT At a distance of 1 foot: limit time to 10 minutes/week At a distance of 3 feet: limit time to 2 hours/week At a distance of 6 feet: no restrictions  AFTER 8 WEEKS No restrictions  PERSONS OVER THE AGE OF 45 AND DO NOT EXPECT TO HAVE ANY MORE CHILDREN No restrictions  Updated by SCP in January 2020

## 2022-08-11 NOTE — Op Note (Unsigned)
NAME: Adrian Skinner, Adrian Skinner MEDICAL RECORD NO: [id number] ACCOUNT NO: [id number] DATE OF BIRTH: 1945/08/23 FACILITY: [facility] LOCATION: [facility] PHYSICIAN: Alexis Frock, MD  Operative Report   DATE OF PROCEDURE: 08/11/2022  PREOPERATIVE DIAGNOSIS:  Moderate risk prostate cancer.  PROCEDURE: 1.  Prostate brachytherapy seed implantation. 2.  SpaceOAR placement. 3.  Cystoscopy.  ESTIMATED BLOOD LOSS:  Nil.  COMPLICATIONS:  None.  SPECIMEN:  None.  FINDINGS: 1.  Excellent placement of brachytherapy seeds as per prescribed radiation plan. 2.  Excellent posterior displacement of anterior rectal wall and SpaceOAR placement. 3.  Unremarkable bladder and prostate with cystoscopy following brachytherapy seed implantation and SpaceOAR placement.  INDICATIONS:  Adrian Skinner is a very pleasant and very vigorous 77 year old man who was found on workup of elevated PSA to have multifocal moderate risk adenocarcinoma of the prostate.  Options discussed for management including surveillance protocols versus  ablative therapy for surgical extirpation and we both agreed that given the smaller size prostate and his age in his late seventies that primary therapy with brachytherapy was ideal.  He underwent consultation with Radiation Oncology, who corroborated he  presents for this today.  Informed consent was obtained and placed in medical record.  OPERATION IN DETAIL:  The patient being, Adrian Skinner, verified and procedure being brachytherapy seed implantation SpaceOAR and cystoscopy was confirmed.  Procedure timeout was performed.  Intravenous antibiotics were administered.  General anesthesia  was induced.  The patient was placed into medium lithotomy position.  Sterile field was created, prepping and draping the patient's penis, perineum, and proximal thighs using iodine, his penis were tucked out of the perineal field.  Transrectal  ultrasound was introduced as was the grid, radiation planning was performed as  per separate Radiation Oncology note.  which called for 62 seeds over 16 catheters.  These were then carefully placed transperineally in anterior, posterior direction under  careful ultrasound guidance, the grid and prostate anchors were removed.  Ultrasound probe was taken off of anterior.  Attention spot fluoroscopic images revealed excellent placement of brachytherapy seeds in expected position.  Next, the SpaceOAR  introducer needle was placed approximately 1 cm anterior to the anterior rectal verge and under ultrasound guidance navigated into the anterior prerectal space mid gland.  A hydrodissection of 2 mL normal saline was placed, which corroborated needle tip  in the prerectal fat.  Next, 10 mL of the gel matrix was introduced over 10 seconds as per manufacturer's guidelines resulted in excellent posterior dissection of the anterior rectal wall, Foley catheter was removed, penis was once again prepped using  iodine.  Cystourethroscopy was performed using 16-French flexible cystoscope.  Flexible cystoscopy revealed unremarkable prostatic urethra, bladder showed minimal trabeculation.  Ureteral orifices were single bilaterally.  No papillary lesions noted.  No evidence of intraluminal brachytherapy seeds noted.  Retroflexion showed no  additional findings.  Cystoscope was removed.  Procedure was terminated.  The patient tolerated procedure well, no immediate perioperative complications.  The patient was taken to postanesthesia care unit in stable condition.  Plan for discharge home  after void.  RADIATION PARAMETERS: 16 catheters delivering 62 seeds for prescribed dose of 145 gray.   SHY D: 08/11/2022 10:35:47 am T: 08/11/2022 10:53:00 am  JOB: [id number]

## 2022-08-11 NOTE — Brief Op Note (Signed)
08/11/2022  10:31 AM  PATIENT:  Garen Grams  77 y.o. male  PRE-OPERATIVE DIAGNOSIS:  PROSTATE CANCER  POST-OPERATIVE DIAGNOSIS:  PROSTATE CANCER  PROCEDURE:  Procedure(s) with comments: RADIOACTIVE SEED IMPLANT/BRACHYTHERAPY IMPLANT (N/A) - 90 MINS SPACE OAR INSTILLATION (N/A)  SURGEON:  Surgeon(s) and Role:    * Alexis Frock, MD - Primary    * Tyler Pita, MD - Assisting  PHYSICIAN ASSISTANT:   ASSISTANTS: none   ANESTHESIA:   general  EBL:  0 mL   BLOOD ADMINISTERED:none  DRAINS: none   LOCAL MEDICATIONS USED:  NONE  SPECIMEN:  No Specimen  DISPOSITION OF SPECIMEN:  N/A  COUNTS:  YES  TOURNIQUET:  * No tourniquets in log *  DICTATION: .Other Dictation: Dictation Number [id number]  PLAN OF CARE: Discharge to home after PACU  PATIENT DISPOSITION:  PACU - hemodynamically stable.   Delay start of Pharmacological VTE agent (>24hrs) due to surgical blood loss or risk of bleeding: yes

## 2022-08-11 NOTE — Progress Notes (Signed)
  Radiation Oncology         [phone]) [phone] ________________________________  Name: Adrian Skinner MRN: [id number]  Date: 08/11/2022  DOB: 07/04/1945       Prostate Seed Implant  [id number], Thayer Jew, MD  No ref. provider found  DIAGNOSIS: 77 y.o. gentleman with Stage T1c adenocarcinoma of the prostate with Gleason score of 3+4, and PSA of 5.75.   Oncology History  Malignant neoplasm of prostate ([place])  05/29/2022 Cancer Staging   Staging form: Prostate, AJCC 8th Edition - Clinical stage from 05/29/2022: Stage IIB (cT1c, cN0, cM0, PSA: 5.8, Grade Group: 2) - Signed by Freeman Caldron, PA-C on 06/20/2022 Histopathologic type: Adenocarcinoma, NOS Stage prefix: Initial diagnosis Prostate specific antigen (PSA) range: Less than 10 Gleason primary pattern: 3 Gleason secondary pattern: 4 Gleason score: 7 Histologic grading system: 5 grade system Number of biopsy cores examined: 12 Number of biopsy cores positive: 5 Location of positive needle core biopsies: Both sides   06/20/2022 Initial Diagnosis   Malignant neoplasm of prostate ([place])       ICD-10-CM   1. Hypertension, unspecified type  I10 I-Stat, Chem 8 on day of surgery per protocol    I-Stat, Chem 8 on day of surgery per protocol      PROCEDURE: Insertion of radioactive I-125 seeds into the prostate gland.  RADIATION DOSE: 145 Gy, definitive therapy.  TECHNIQUE: Adrian Skinner was brought to the operating room with the urologist. He was placed in the dorsolithotomy position. He was catheterized and a rectal tube was inserted. The perineum was shaved, prepped and draped. The ultrasound probe was then introduced by me into the rectum to see the prostate gland.  TREATMENT DEVICE: I attached the needle grid to the ultrasound probe stand and anchor needles were placed.  3D PLANNING: The prostate was imaged in 3D using a sagittal sweep of the prostate probe. These images were transferred to the planning computer. There, the prostate,  urethra and rectum were defined on each axial reconstructed image. Then, the software created an optimized 3D plan and a few seed positions were adjusted. The quality of the plan was reviewed using [facility] information for the target and the following two organs at risk:  Urethra and Rectum.  Then the accepted plan was printed and handed off to the radiation therapist.  Under my supervision, the custom loading of the seeds and spacers was carried out using the quick loader.  These pre-loaded needles were then placed into the needle holder.Marland Kitchen  PROSTATE VOLUME STUDY:  Using transrectal ultrasound the volume of the prostate was verified to be 36.9 cc.  SPECIAL TREATMENT PROCEDURE/SUPERVISION AND HANDLING: The pre-loaded needles were then delivered by the urologist under sagittal guidance. A total of 16 needles were used to deposit 62 seeds in the prostate gland. The individual seed activity was 0.428 mCi.  SpaceOAR:  Yes  COMPLEX SIMULATION: At the end of the procedure, an anterior radiograph of the pelvis was obtained to document seed positioning and count. Cystoscopy was performed by the urologist to check the urethra and bladder.  MICRODOSIMETRY: At the end of the procedure, the patient was emitting 0.09 mR/hr at 1 meter. Accordingly, he was considered safe for hospital discharge.  PLAN: The patient will return to the radiation oncology clinic for post implant CT dosimetry in three weeks.   ________________________________  Sheral Apley Tammi Klippel, M.D.

## 2022-08-11 NOTE — Transfer of Care (Signed)
Immediate Anesthesia Transfer of Care Note  Patient: Adrian Skinner  Procedure(s) Performed: Procedure(s) (LRB): RADIOACTIVE SEED IMPLANT/BRACHYTHERAPY IMPLANT (N/A) SPACE OAR INSTILLATION (N/A)  Patient Location: PACU  Anesthesia Type: General  Level of Consciousness: awake, sedated, patient cooperative and responds to stimulation  Airway & Oxygen Therapy: Patient Spontanous Breathing and Patient connected to [place] oxygen  Post-op Assessment: Report given to PACU RN, Post -op Vital signs reviewed and stable and Patient moving all extremities  Post vital signs: Reviewed and stable  Complications: No apparent anesthesia complications

## 2022-08-11 NOTE — Anesthesia Postprocedure Evaluation (Signed)
Anesthesia Post Note  Patient: Adrian Skinner  Procedure(s) Performed: RADIOACTIVE SEED IMPLANT/BRACHYTHERAPY IMPLANT (Prostate) SPACE OAR INSTILLATION (Prostate)     Patient location during evaluation: PACU Anesthesia Type: General Level of consciousness: awake and alert Pain management: pain level controlled Vital Signs Assessment: post-procedure vital signs reviewed and stable Respiratory status: spontaneous breathing, nonlabored ventilation and respiratory function stable Cardiovascular status: blood pressure returned to baseline Postop Assessment: no apparent nausea or vomiting Anesthetic complications: no   No notable events documented.  Last Vitals:  Vitals:   08/11/22 1102 08/11/22 1115  BP: 139/80 (!) 147/88  Pulse: 83 87  Resp: 11 16  Temp:    SpO2: 95% 95%    Last Pain:  Vitals:   08/11/22 1115  TempSrc:   PainSc: 0-No pain                 Marthenia Rolling

## 2022-08-14 ENCOUNTER — Encounter (HOSPITAL_BASED_OUTPATIENT_CLINIC_OR_DEPARTMENT_OTHER): Payer: Self-pay | Admitting: Urology

## 2022-08-20 DIAGNOSIS — G4733 Obstructive sleep apnea (adult) (pediatric): Secondary | ICD-10-CM | POA: Diagnosis not present

## 2022-08-22 ENCOUNTER — Telehealth: Payer: Self-pay | Admitting: Cardiology

## 2022-08-22 NOTE — Telephone Encounter (Signed)
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 He denies any SOB or other problems. Patient aware he has not been seen since 2022 and an appointment will need to be made to make changes in medications. Patient voiced understanding and Follow up scheduled

## 2022-08-22 NOTE — Telephone Encounter (Signed)
Pt c/o swelling: STAT is pt has developed SOB within 24 hours  If swelling, where is the swelling located? Ankles   How much weight have you gained and in what time span? Not sure   Have you gained 3 pounds in a day or 5 pounds in a week? Not sure    Do you have a log of your daily weights (if so, list)? Did not take   Are you currently taking a fluid pill? Yes   Are you currently SOB? No   Have you traveled recently?  No   Pt states he doesn't believe the fluid pill he is taking is working because he is still swelling. Please advise.

## 2022-08-26 ENCOUNTER — Emergency Department (HOSPITAL_COMMUNITY)
Admission: EM | Admit: 2022-08-26 | Discharge: 2022-08-26 | Disposition: A | Discharge status: Home / Self Care | Payer: PPO | Attending: Emergency Medicine | Admitting: Emergency Medicine

## 2022-08-26 ENCOUNTER — Encounter (HOSPITAL_COMMUNITY): Payer: Self-pay

## 2022-08-26 DIAGNOSIS — R339 Retention of urine, unspecified: Secondary | ICD-10-CM | POA: Diagnosis not present

## 2022-08-26 LAB — URINALYSIS, ROUTINE W REFLEX MICROSCOPIC
Bilirubin Urine: NEGATIVE
Glucose, UA: NEGATIVE mg/dL
Ketones, ur: NEGATIVE mg/dL
Nitrite: NEGATIVE
Protein, ur: NEGATIVE mg/dL
Specific Gravity, Urine: 1.013 (ref 1.005–1.030)
pH: 5 (ref 5.0–8.0)

## 2022-08-26 LAB — CBC WITH DIFFERENTIAL/PLATELET
Abs Immature Granulocytes: 0.08 10*3/uL — ABNORMAL HIGH (ref 0.00–0.07)
Basophils Absolute: 0 10*3/uL (ref 0.0–0.1)
Basophils Relative: 0 %
Eosinophils Absolute: 0.1 10*3/uL (ref 0.0–0.5)
Eosinophils Relative: 0 %
HCT: 39 % (ref 39.0–52.0)
Hemoglobin: 12.9 g/dL — ABNORMAL LOW (ref 13.0–17.0)
Immature Granulocytes: 1 %
Lymphocytes Relative: 12 %
Lymphs Abs: 2 10*3/uL (ref 0.7–4.0)
MCH: 31.5 pg (ref 26.0–34.0)
MCHC: 33.1 g/dL (ref 30.0–36.0)
MCV: 95.4 fL (ref 80.0–100.0)
Monocytes Absolute: 1.3 10*3/uL — ABNORMAL HIGH (ref 0.1–1.0)
Monocytes Relative: 8 %
Neutro Abs: 13.2 10*3/uL — ABNORMAL HIGH (ref 1.7–7.7)
Neutrophils Relative %: 79 %
Platelets: 215 10*3/uL (ref 150–400)
RBC: 4.09 MIL/uL — ABNORMAL LOW (ref 4.22–5.81)
RDW: 14 % (ref 11.5–15.5)
WBC: 16.7 10*3/uL — ABNORMAL HIGH (ref 4.0–10.5)
nRBC: 0 % (ref 0.0–0.2)

## 2022-08-26 LAB — BASIC METABOLIC PANEL
Anion gap: 8 (ref 5–15)
BUN: 25 mg/dL — ABNORMAL HIGH (ref 8–23)
CO2: 26 mmol/L (ref 22–32)
Calcium: 8.1 mg/dL — ABNORMAL LOW (ref 8.9–10.3)
Chloride: 102 mmol/L (ref 98–111)
Creatinine, Ser: 1.4 mg/dL — ABNORMAL HIGH (ref 0.61–1.24)
GFR, Estimated: 52 mL/min — ABNORMAL LOW (ref >60)
Glucose, Bld: 108 mg/dL — ABNORMAL HIGH (ref 70–99)
Potassium: 3.3 mmol/L — ABNORMAL LOW (ref 3.5–5.1)
Sodium: 136 mmol/L (ref 135–145)

## 2022-08-26 MED ORDER — CEPHALEXIN 500 MG PO CAPS: 500.0000 mg | ORAL_CAPSULE | Freq: Four times a day (QID) | ORAL | 0 refills | Status: DC

## 2022-08-26 NOTE — ED Triage Notes (Signed)
Pt arrived via POV, c/o urinary retention and constipation after recent prostate procedure

## 2022-08-26 NOTE — ED Notes (Signed)
Bladder scan revealed 431m of urine PVR. Informed patient that will have to place urinary catheter.

## 2022-08-26 NOTE — Discharge Instructions (Addendum)
Return for any problem.   Please take Keflex as prescribed - this antibiotic will help prevent urinary infection given recent urologic procedure / foley catheter placement.

## 2022-08-26 NOTE — ED Provider Notes (Signed)
Adrian Skinner AT [facility] Provider Note   CSN: [id number] Arrival date & time: 08/26/22  1439     History  Chief Complaint  Patient presents with   Urinary Retention   Constipation    Adrian Skinner is a 77 y.o. male.  77 year old male with prior medical history as detailed below presents for evaluation.  Patient reports that he had procedure with Dr. Tresa Moore on the 16th.  Patient reports that his prostate was seated.  Patient reports symptoms of urinary retention over the last 48 hours.  Patient reports significant retention and that he has only been able to produce dribbles of urine.  He complains of a sensation of fullness over his pubis.  He denies fever.  Denies nausea or vomiting.  He was given tramadol for use after the procedure.  He has not taken any.  He contacted the on-call urology service.  He was advised to come to the ED for likely Foley placement.    The history is provided by the patient and medical records.       Home Medications Prior to Admission medications   Medication Sig Start Date End Date Taking? Authorizing Provider  colchicine 0.6 MG tablet Take 0.6 mg by mouth daily. 07/05/22  Yes [provider]  Azelastine HCl 137 MCG/SPRAY SOLN Place into the nose.    [provider]  Evolocumab (REPATHA SURECLICK) [id number] MG/ML SOAJ  02/22/19   [provider]  furosemide (LASIX) 20 MG tablet Take 1 tablet (20 mg total) by mouth daily as needed for edema. 02/24/21 05/25/21  Minus Breeding, MD  omeprazole (PRILOSEC) 40 MG capsule Take by mouth. 07/13/16   [provider]  pramipexole (MIRAPEX) 0.5 MG tablet Take by mouth. Take 2 at bedtime 05/28/20   [provider]  senna-docusate (SENOKOT-S) 8.6-50 MG tablet Take 1 tablet by mouth 2 (two) times daily. While taking strong pain meds to prevent constipation 08/11/22   Alexis Frock, MD  sertraline (ZOLOFT) 25 MG tablet Take 25 mg by mouth daily.     [provider]  tamsulosin (FLOMAX) 0.4 MG CAPS capsule  12/29/17   [provider]  traMADol (ULTRAM) 50 MG tablet Take 1-2 tablets (50-100 mg total) by mouth every 6 (six) hours as needed for moderate pain or severe pain (post-operatively). 08/11/22 08/11/23  Alexis Frock, MD  TRIAMTERENE PO Take 10 mg by mouth. 10 mg per pt questionaire    [provider]      Allergies    Patient has no known allergies.    Review of Systems   Review of Systems  All other systems reviewed and are negative.   Physical Exam Updated Vital Signs BP 127/72 (BP Location: Left Arm)   Pulse (!) 114   Temp 99.4 F (37.4 C) (Oral)   Resp 18   SpO2 97%  Physical Exam Vitals and nursing note reviewed.  Constitutional:      General: He is not in acute distress.    Appearance: Normal appearance. He is well-developed.  HENT:     Head: Normocephalic and atraumatic.  Eyes:     Conjunctiva/sclera: Conjunctivae normal.     Pupils: Pupils are equal, round, and reactive to light.  Cardiovascular:     Rate and Rhythm: Normal rate and regular rhythm.     Heart sounds: Normal heart sounds.  Pulmonary:     Effort: Pulmonary effort is normal. No respiratory distress.     Breath sounds: Normal  breath sounds.  Abdominal:     General: There is no distension.     Palpations: Abdomen is soft.     Tenderness: There is no abdominal tenderness.  Genitourinary:    Comments: Mild tenderness overlying the bladder in the suprapubic area. Musculoskeletal:        General: No deformity. Normal range of motion.     Cervical back: Normal range of motion and neck supple.  Skin:    General: Skin is warm and dry.  Neurological:     General: No focal deficit present.     Mental Status: He is alert and oriented to person, place, and time.     ED Results / Procedures / Treatments   Labs (all labs ordered are listed, but only abnormal results are displayed) Labs Reviewed  CBC WITH  DIFFERENTIAL/PLATELET - Abnormal; Notable for the following components:      Result Value   WBC 16.7 (*)    RBC 4.09 (*)    Hemoglobin 12.9 (*)    Neutro Abs 13.2 (*)    Monocytes Absolute 1.3 (*)    Abs Immature Granulocytes 0.08 (*)    All other components within normal limits  BASIC METABOLIC PANEL - Abnormal; Notable for the following components:   Potassium 3.3 (*)    Glucose, Bld 108 (*)    BUN 25 (*)    Creatinine, Ser 1.40 (*)    Calcium 8.1 (*)    GFR, Estimated 52 (*)    All other components within normal limits  URINALYSIS, ROUTINE W REFLEX MICROSCOPIC - Abnormal; Notable for the following components:   Hgb urine dipstick SMALL (*)    Leukocytes,Ua MODERATE (*)    Bacteria, UA RARE (*)    All other components within normal limits    EKG None  Radiology No results found.  Procedures Procedures    Medications Ordered in ED Medications - No data to display  ED Course/ Medical Decision Making/ A&P                             Medical Decision Making Amount and/or Complexity of Data Reviewed Labs: ordered.    Medical Screen Complete  This patient presented to the ED with complaint of urinary retention.  This complaint involves an extensive number of treatment options. The initial differential diagnosis includes, but is not limited to, urinary retention, acute kidney injury, metabolic abnormality, etc.  This presentation is: Acute, Self-Limited, Previously Undiagnosed, Uncertain Prognosis, Complicated, Systemic Symptoms, and Threat to Life/Bodily Function  Patient is presenting with complaints of urinary retention after recent urologic procedure with Dr. Tammi Klippel.  Patient with evidence of retention.  Bladder scan reveals approximately 500 cc of urine postvoid.  Catheter placed without difficulty.  Patient feels improved with easily draining bladder.  BUN is 25, creatinine is 1.4.  This is slightly above baseline.  Case discussed with Dr. Alinda Money  covering urology.  Patient is appropriate for discharge with close follow-up in the urology clinic on Monday.  Importance of close follow-up discussed with the patient.  Strict return precautions given and understood.      Additional history obtained:  External records from outside sources obtained and reviewed including prior ED visits and prior Inpatient records.    Lab Tests:  I ordered and personally interpreted labs.  The pertinent results include: CBC, UA, BMP   Problem List / ED Course:  Urinary retention   Reevaluation:  After the  interventions noted above, I reevaluated the patient and found that they have: improved Disposition:  After consideration of the diagnostic results and the patients response to treatment, I feel that the patent would benefit from close outpatient follow-up.          Final Clinical Impression(s) / ED Diagnoses Final diagnoses:  Urinary retention    Rx / DC Orders ED Discharge Orders          Ordered    cephALEXin (KEFLEX) 500 MG capsule  4 times daily        08/26/22 1750              Valarie Merino, MD 08/26/22 1752

## 2022-08-28 DIAGNOSIS — M79644 Pain in right finger(s): Secondary | ICD-10-CM | POA: Diagnosis not present

## 2022-08-29 ENCOUNTER — Telehealth: Payer: Self-pay

## 2022-08-29 NOTE — Telephone Encounter (Signed)
     Patient  visit on 08/26/2022  at [facility] was for Urinary Retention  Constipation.  Have you been able to follow up with your primary care physician? Yes  The patient was or was not able to obtain any needed medicine or equipment. Patient was able to obtain medication.  Are there diet recommendations that you are having difficulty following? No  Patient expresses understanding of discharge instructions and education provided has no other needs at this time. Yes   Lydia Resource Care Guide   [email or web address]  ?? [id number]   Website: [email or web address]  [email or web address]

## 2022-08-30 ENCOUNTER — Encounter: Payer: Self-pay | Admitting: Urology

## 2022-08-30 ENCOUNTER — Telehealth: Payer: Self-pay | Admitting: *Deleted

## 2022-08-30 NOTE — Progress Notes (Signed)
Post-seed nursing interview for a diagnosis of Malignant Neoplasm of Prostate.  Patient identity verified. Patient reports, incomplete emptying of bladder, and a pulling/ burning sensation of skin at insertion site of catheter (tip of penis), pain 6/10. Patient notified by a nurse to use neosporin, which is helping mildly. No other issues conveyed at this time.  Meaningful use complete. I-PSS score- 2 mild SHIM score- 1 Urinary Management medication(s) Tamsulosin Urology appointment date- 09/04/22 w/ Dr. Berneice Heinrich at [facility] [place]  Vitals- BP (!) 139/92 (BP Location: Left Arm, Patient Position: Sitting)   Pulse 80   Temp (!) 97.3 F (36.3 C) (Temporal)   Resp 18   Ht 5\' 10"  (1.778 m)   Wt 238 lb (108 kg)   SpO2 98%   BMI 34.15 kg/m     This concludes the interview.   Ruel Favors, LPN

## 2022-08-30 NOTE — Progress Notes (Signed)
Radiation Oncology         [phone] ________________________________  Name: Adrian Skinner MRN: [id number]  Date: 08/31/2022  DOB: 1946/02/14  Post-Seed Follow-Up Visit Note  CC: Merri Brunette, MD  Sebastian Ache, MD  Diagnosis:   77 y.o. gentleman with Stage T1c adenocarcinoma of the prostate with Gleason score of 3+4, and PSA of 5.75.      ICD-10-CM   1. Malignant neoplasm of prostate (HCC)  C61       Interval Since Last Radiation:  3 weeks 08/11/22:  Insertion of radioactive I-125 seeds into the prostate gland; 145 Gy, definitive therapy with placement of SpaceOAR gel.  Narrative:  The patient returns today for routine follow-up.  Unfortunately, he had to have a foley catheter placed in the ED on 08/26/22 due to retention and suspected UTI. He was started on Keflex at that time as well and continues taking this as directed. He denies fever, chills, gross hematuria, flank pain or N/V. He does have significant irritation in the tip of the penis at the catheter insertion site and has been applying neosporin regularly without relief. Otherwise, he is tolerating the catheter and it has continued to drain appropriately. His pre-implant IPSS score was 9 and tolerable on Flomax daily. He denies any abdominal pain or bowel symptoms. He has some hemorrhoids that are aggravated but he is managing those without issue.   ALLERGIES:  has No Known Allergies.  Meds: Current Outpatient Medications  Medication Sig Dispense Refill   Azelastine HCl 137 MCG/SPRAY SOLN Place into the nose.     cephALEXin (KEFLEX) 500 MG capsule Take 1 capsule (500 mg total) by mouth 4 (four) times daily. 28 capsule 0   colchicine 0.6 MG tablet Take 0.6 mg by mouth daily.     Evolocumab (REPATHA SURECLICK) 140 MG/ML SOAJ      furosemide (LASIX) 20 MG tablet Take 1 tablet (20 mg total) by mouth daily as needed for edema. 30 tablet 3   omeprazole (PRILOSEC) 40 MG capsule Take by mouth.     pramipexole (MIRAPEX) 0.5 MG  tablet Take by mouth. Take 2 at bedtime     senna-docusate (SENOKOT-S) 8.6-50 MG tablet Take 1 tablet by mouth 2 (two) times daily. While taking strong pain meds to prevent constipation 10 tablet 0   sertraline (ZOLOFT) 25 MG tablet Take 25 mg by mouth daily.     tamsulosin (FLOMAX) 0.4 MG CAPS capsule      traMADol (ULTRAM) 50 MG tablet Take 1-2 tablets (50-100 mg total) by mouth every 6 (six) hours as needed for moderate pain or severe pain (post-operatively). 15 tablet 0   TRIAMTERENE PO Take 10 mg by mouth. 10 mg per pt questionaire     No current facility-administered medications for this encounter.    Physical Findings: In general this is a well appearing Caucasian male in no acute distress. He's alert and oriented x4 and appropriate throughout the examination. Cardiopulmonary assessment is negative for acute distress and he exhibits normal effort.   Lab Findings: Lab Results  Component Value Date   WBC 16.7 (H) 08/26/2022   HGB 12.9 (L) 08/26/2022   HCT 39.0 08/26/2022   MCV 95.4 08/26/2022   PLT 215 08/26/2022    Radiographic Findings:  Patient underwent CT imaging in our clinic for post implant dosimetry. The CT will be reviewed by Dr. Kathrynn Running to confirm there is an adequate distribution of radioactive seeds throughout the prostate gland and ensure that there are no  seeds in or near the rectum.  We suspect the final radiation plan and dosimetry will show appropriate coverage of the prostate gland. He understands that we will call and inform him of any unexpected findings on further review of his imaging and dosimetry.  Impression/Plan: 77 y.o. gentleman with Stage T1c adenocarcinoma of the prostate with Gleason score of 3+4, and PSA of 5.75.   The patient is recovering from the effects of radiation. His urinary symptoms should gradually improve over the next 4-6 months. We talked about this today. He is tolerating the foley catheter well, aside from irritation at the insertion  site, and is otherwise pleased with his outcome. I am sending a Rx for topical lidocaine gel to apply at the tip of the penis as needed to help with the severe irritation he is experiencing. We also talked about long-term follow-up for prostate cancer following seed implant. He understands that ongoing PSA determinations and digital rectal exams will help perform surveillance to rule out disease recurrence. He has a follow up appointment scheduled with Dr. Berneice Heinrich on 09/04/22. He understands what to expect with his PSA measures. Patient was also educated today about some of the long-term effects from radiation including a small risk for rectal bleeding and possibly erectile dysfunction. We talked about some of the general management approaches to these potential complications. However, I did encourage the patient to contact our office or return at any point if he has questions or concerns related to his previous radiation and prostate cancer.    Marguarite Arbour, PA-C

## 2022-08-30 NOTE — Progress Notes (Signed)
Pt. Currently has a catheter placed on 08/25/22 by a nurse.

## 2022-08-30 NOTE — Telephone Encounter (Signed)
CALLED PATIENT TO REMIND OF POST SEED APPTS. FOR 08-31-22, LVM FOR A RETURN CALL

## 2022-08-30 NOTE — Progress Notes (Signed)
  Radiation Oncology         [phone] ________________________________  Name: Adrian Skinner MRN: [id number]  Date: 08/31/2022  DOB: 10/06/1945  COMPLEX SIMULATION NOTE  NARRATIVE:  The patient was brought to the [place] suite today following prostate seed implantation approximately one month ago.  Identity was confirmed.  All relevant records and images related to the planned course of therapy were reviewed.  Then, the patient was set-up supine.  CT images were obtained.  The CT images were loaded into the planning software.  Then the prostate and rectum were contoured.  Treatment planning then occurred.  The implanted iodine 125 seeds were identified by the physics staff for projection of radiation distribution  I have requested : 3D Simulation  I have requested a DVH of the following structures: Prostate and rectum.    ________________________________  Sheral Apley Tammi Klippel, M.D.

## 2022-08-31 ENCOUNTER — Other Ambulatory Visit: Payer: Self-pay

## 2022-08-31 ENCOUNTER — Encounter: Payer: Self-pay | Admitting: Urology

## 2022-08-31 ENCOUNTER — Ambulatory Visit
Admission: RE | Admit: 2022-08-31 | Discharge: 2022-08-31 | Disposition: A | Discharge status: Home / Self Care | Payer: PPO | Source: Ambulatory Visit | Attending: Urology | Admitting: Urology

## 2022-08-31 ENCOUNTER — Ambulatory Visit
Admission: RE | Admit: 2022-08-31 | Discharge: 2022-08-31 | Disposition: A | Discharge status: Home / Self Care | Payer: PPO | Source: Ambulatory Visit | Attending: Radiation Oncology | Admitting: Radiation Oncology

## 2022-08-31 VITALS — BP 139/92 | HR 80 | Temp 97.3°F | Resp 18 | Ht 70.0 in | Wt 238.0 lb

## 2022-08-31 DIAGNOSIS — Z923 Personal history of irradiation: Secondary | ICD-10-CM | POA: Insufficient documentation

## 2022-08-31 DIAGNOSIS — C61 Malignant neoplasm of prostate: Secondary | ICD-10-CM | POA: Insufficient documentation

## 2022-08-31 DIAGNOSIS — Z79899 Other long term (current) drug therapy: Secondary | ICD-10-CM | POA: Insufficient documentation

## 2022-08-31 MED ORDER — LIDOCAINE HCL URETHRAL/MUCOSAL 2 % EX GEL: 1.0000 | CUTANEOUS | 0 refills | Status: DC | PRN

## 2022-09-04 DIAGNOSIS — R338 Other retention of urine: Secondary | ICD-10-CM | POA: Diagnosis not present

## 2022-09-05 ENCOUNTER — Ambulatory Visit: Payer: PPO | Admitting: Cardiology

## 2022-09-14 ENCOUNTER — Encounter: Payer: Self-pay | Admitting: Radiation Oncology

## 2022-09-14 DIAGNOSIS — M545 Low back pain, unspecified: Secondary | ICD-10-CM | POA: Diagnosis not present

## 2022-09-14 DIAGNOSIS — C61 Malignant neoplasm of prostate: Secondary | ICD-10-CM | POA: Diagnosis not present

## 2022-09-14 DIAGNOSIS — R102 Pelvic and perineal pain: Secondary | ICD-10-CM | POA: Diagnosis not present

## 2022-09-14 DIAGNOSIS — Z191 Hormone sensitive malignancy status: Secondary | ICD-10-CM | POA: Diagnosis not present

## 2022-09-14 NOTE — Progress Notes (Signed)
  Radiation Oncology         [phone] ________________________________  Name: FLAVEL COSTNER MRN: [id number]  Date: 09/14/2022  DOB: 10-Jul-1945  3D Planning Note   Prostate Brachytherapy Post-Implant Dosimetry  Diagnosis:  77 y.o. gentleman with Stage T1c adenocarcinoma of the prostate with Gleason score of 3+4, and PSA of 5.75.   Narrative: On a previous date, Adrian Skinner returned following prostate seed implantation for post implant planning. He underwent CT scan complex simulation to delineate the three-dimensional structures of the pelvis and demonstrate the radiation distribution.  Since that time, the seed localization, and complex isodose planning with dose volume histograms have now been completed.  Results:   Prostate Coverage - The dose of radiation delivered to the 90% or more of the prostate gland (D90) was 108.01% of the prescription dose. This exceeds our goal of greater than 90%. Rectal Sparing - The volume of rectal tissue receiving the prescription dose or higher was 0.0 cc. This falls under our thresholds tolerance of 1.0 cc.  Impression: The prostate seed implant appears to show adequate target coverage and appropriate rectal sparing.  Plan:  The patient will continue to follow with urology for ongoing PSA determinations. I would anticipate a high likelihood for local tumor control with minimal risk for rectal morbidity.  ________________________________  Sheral Apley Tammi Klippel, M.D.

## 2022-09-15 DIAGNOSIS — M79641 Pain in right hand: Secondary | ICD-10-CM | POA: Diagnosis not present

## 2022-09-15 DIAGNOSIS — M79642 Pain in left hand: Secondary | ICD-10-CM | POA: Diagnosis not present

## 2022-09-18 DIAGNOSIS — G4733 Obstructive sleep apnea (adult) (pediatric): Secondary | ICD-10-CM | POA: Diagnosis not present

## 2022-09-21 DIAGNOSIS — G8929 Other chronic pain: Secondary | ICD-10-CM | POA: Diagnosis not present

## 2022-09-21 DIAGNOSIS — Z6830 Body mass index (BMI) 30.0-30.9, adult: Secondary | ICD-10-CM | POA: Diagnosis not present

## 2022-10-17 ENCOUNTER — Encounter: Payer: Self-pay | Admitting: *Deleted

## 2022-10-17 ENCOUNTER — Telehealth: Payer: Self-pay | Admitting: Radiation Oncology

## 2022-10-17 NOTE — Telephone Encounter (Signed)
4/23 @ 10:36 am patient called about a missed call from our number from today and they did not left voicemail -per patient.  Check notes for today, no notes was entered, explain to patient it may have been a reminder call for his appt for 4/25, patient stated he did not know anything about this appt. through [facility].  Reach out to [facility] scheduler [facility]) so they could further assist patient.

## 2022-10-19 ENCOUNTER — Encounter: Payer: Self-pay | Admitting: *Deleted

## 2022-10-19 ENCOUNTER — Inpatient Hospital Stay: Payer: PPO | Attending: Nurse Practitioner | Admitting: *Deleted

## 2022-10-19 DIAGNOSIS — C61 Malignant neoplasm of prostate: Secondary | ICD-10-CM

## 2022-10-19 DIAGNOSIS — G4733 Obstructive sleep apnea (adult) (pediatric): Secondary | ICD-10-CM | POA: Diagnosis not present

## 2022-10-19 NOTE — Progress Notes (Signed)
SCP reviewed and completed. 

## 2022-10-21 DIAGNOSIS — M545 Low back pain, unspecified: Secondary | ICD-10-CM | POA: Diagnosis not present

## 2022-10-21 DIAGNOSIS — R102 Pelvic and perineal pain: Secondary | ICD-10-CM | POA: Diagnosis not present

## 2022-10-26 DIAGNOSIS — M7542 Impingement syndrome of left shoulder: Secondary | ICD-10-CM | POA: Diagnosis not present

## 2022-10-26 DIAGNOSIS — M5416 Radiculopathy, lumbar region: Secondary | ICD-10-CM | POA: Diagnosis not present

## 2022-10-26 DIAGNOSIS — Z4889 Encounter for other specified surgical aftercare: Secondary | ICD-10-CM | POA: Diagnosis not present

## 2022-10-26 DIAGNOSIS — M545 Low back pain, unspecified: Secondary | ICD-10-CM | POA: Diagnosis not present

## 2022-10-26 DIAGNOSIS — M546 Pain in thoracic spine: Secondary | ICD-10-CM | POA: Diagnosis not present

## 2022-10-30 ENCOUNTER — Telehealth: Payer: Self-pay

## 2022-10-30 NOTE — Telephone Encounter (Signed)
RN returned call to patient finished radiation treatment for Stage T1c adenocarcinoma of the prostate with brachytherapy and SpaceOAR gel placement for treatment of his disease.  Adrian Skinner had question about getting a MRI for his back since he had seeds implanted.  As per the nurse navigator he was advised that since the seeds are titanium he shouldn't have any issues.  He was appreciative of the information and we ended call.

## 2022-10-31 DIAGNOSIS — M5416 Radiculopathy, lumbar region: Secondary | ICD-10-CM | POA: Diagnosis not present

## 2022-10-31 DIAGNOSIS — M546 Pain in thoracic spine: Secondary | ICD-10-CM | POA: Diagnosis not present

## 2022-10-31 DIAGNOSIS — M791 Myalgia, unspecified site: Secondary | ICD-10-CM | POA: Diagnosis not present

## 2022-11-02 DIAGNOSIS — M6208 Separation of muscle (nontraumatic), other site: Secondary | ICD-10-CM | POA: Diagnosis not present

## 2022-11-07 DIAGNOSIS — C61 Malignant neoplasm of prostate: Secondary | ICD-10-CM | POA: Diagnosis not present

## 2022-11-14 DIAGNOSIS — N5201 Erectile dysfunction due to arterial insufficiency: Secondary | ICD-10-CM | POA: Diagnosis not present

## 2022-11-14 DIAGNOSIS — R338 Other retention of urine: Secondary | ICD-10-CM | POA: Diagnosis not present

## 2022-11-14 DIAGNOSIS — C61 Malignant neoplasm of prostate: Secondary | ICD-10-CM | POA: Diagnosis not present

## 2022-11-18 DIAGNOSIS — G4733 Obstructive sleep apnea (adult) (pediatric): Secondary | ICD-10-CM | POA: Diagnosis not present

## 2022-11-22 DIAGNOSIS — M545 Low back pain, unspecified: Secondary | ICD-10-CM | POA: Diagnosis not present

## 2022-11-27 DIAGNOSIS — M545 Low back pain, unspecified: Secondary | ICD-10-CM | POA: Diagnosis not present

## 2022-11-29 DIAGNOSIS — M5416 Radiculopathy, lumbar region: Secondary | ICD-10-CM | POA: Diagnosis not present

## 2022-12-07 DIAGNOSIS — M5416 Radiculopathy, lumbar region: Secondary | ICD-10-CM | POA: Diagnosis not present

## 2022-12-14 DIAGNOSIS — H52203 Unspecified astigmatism, bilateral: Secondary | ICD-10-CM | POA: Diagnosis not present

## 2022-12-14 DIAGNOSIS — H01002 Unspecified blepharitis right lower eyelid: Secondary | ICD-10-CM | POA: Diagnosis not present

## 2022-12-14 DIAGNOSIS — H01005 Unspecified blepharitis left lower eyelid: Secondary | ICD-10-CM | POA: Diagnosis not present

## 2022-12-14 DIAGNOSIS — H26493 Other secondary cataract, bilateral: Secondary | ICD-10-CM | POA: Diagnosis not present

## 2022-12-19 DIAGNOSIS — G4733 Obstructive sleep apnea (adult) (pediatric): Secondary | ICD-10-CM | POA: Diagnosis not present

## 2022-12-27 DIAGNOSIS — H26492 Other secondary cataract, left eye: Secondary | ICD-10-CM | POA: Diagnosis not present

## 2023-01-02 DIAGNOSIS — M9905 Segmental and somatic dysfunction of pelvic region: Secondary | ICD-10-CM | POA: Diagnosis not present

## 2023-01-02 DIAGNOSIS — M9903 Segmental and somatic dysfunction of lumbar region: Secondary | ICD-10-CM | POA: Diagnosis not present

## 2023-01-02 DIAGNOSIS — M25552 Pain in left hip: Secondary | ICD-10-CM | POA: Diagnosis not present

## 2023-01-02 DIAGNOSIS — M5136 Other intervertebral disc degeneration, lumbar region: Secondary | ICD-10-CM | POA: Diagnosis not present

## 2023-01-03 DIAGNOSIS — H26491 Other secondary cataract, right eye: Secondary | ICD-10-CM | POA: Diagnosis not present

## 2023-01-03 NOTE — Progress Notes (Deleted)
Office Visit Note  Patient: Adrian Skinner             Date of Birth: 1945/12/19           MRN: [id number]             PCP: Merri Brunette, MD Referring: Eugenia Mcalpine, MD Visit Date: 01/17/2023 Occupation: @GUAROCC @  Subjective:  No chief complaint on file.   History of Present Illness: Adrian Skinner is a 77 y.o. male ***     Activities of Daily Living:  Patient reports morning stiffness for *** {minute/hour:19697}.   Patient {ACTIONS;DENIES/REPORTS:21021675::"Denies"} nocturnal pain.  Difficulty dressing/grooming: {ACTIONS;DENIES/REPORTS:21021675::"Denies"} Difficulty climbing stairs: {ACTIONS;DENIES/REPORTS:21021675::"Denies"} Difficulty getting out of chair: {ACTIONS;DENIES/REPORTS:21021675::"Denies"} Difficulty using hands for taps, buttons, cutlery, and/or writing: {ACTIONS;DENIES/REPORTS:21021675::"Denies"}  No Rheumatology ROS completed.   PMFS History:  Patient Active Problem List   Diagnosis Date Noted   Malignant neoplasm of prostate (HCC) 06/20/2022   Leg swelling 11/04/2020   Dyslipidemia 11/04/2020   Prediabetes 05/13/2020   Mild dehydration 05/13/2020   Vitamin D deficiency 05/13/2020   SOBOE (shortness of breath on exertion) 04/29/2020   Other fatigue 04/29/2020   Other hyperlipidemia 04/29/2020   Hypertension 04/29/2020   Mood disorder (HCC)- EE 04/29/2020   Educated about COVID-19 virus infection 01/29/2020   Essential hypertension 01/29/2020   Coronary artery disease involving native coronary artery of native heart without angina pectoris 01/29/2020   SOB (shortness of breath) 01/29/2020   OSA (obstructive sleep apnea) 11/25/2014   Advanced sleep phase syndrome 11/25/2014   Chest pain 11/06/2013    Past Medical History:  Diagnosis Date   Anemia    Anxiety    Back pain    Back pain    Bilateral swelling of feet    Cataract    Environmental and seasonal allergies    Essential hypertension    GERD (gastroesophageal reflux disease)     Headache    Hiatal hernia    High blood pressure    Hyperlipidemia    Insomnia    Migraine without status migrainosus, not intractable    OSA (obstructive sleep apnea) 11/25/2014   Reflux    RLS (restless legs syndrome)    Sleep apnea    Vertigo     Family History  Adopted: Yes  Family history unknown: Yes   Past Surgical History:  Procedure Laterality Date   FOREARM SURGERY     LEFT HEART CATHETERIZATION WITH CORONARY ANGIOGRAM N/A 11/11/2013   Procedure: LEFT HEART CATHETERIZATION WITH CORONARY ANGIOGRAM;  Surgeon: Peter M Swaziland, MD;  Location: [facility] CATH LAB;  Service: Cardiovascular;  Laterality: N/A;   NASAL SINUS SURGERY     RADIOACTIVE SEED IMPLANT N/A 08/11/2022   Procedure: RADIOACTIVE SEED IMPLANT/BRACHYTHERAPY IMPLANT;  Surgeon: Sebastian Ache, MD;  [facility];  Service: Urology;  Laterality: N/A;  90 MINS   ROTATOR CUFF REPAIR Right    right   SPACE OAR INSTILLATION N/A 08/11/2022   Procedure: SPACE OAR INSTILLATION;  Surgeon: Sebastian Ache, MD;  Location: [facility];  Service: Urology;  Laterality: N/A;   TONSILLECTOMY AND ADENOIDECTOMY     Social History   Social History Narrative   Retired.  Part time driver for [employer].  Lives at home with wife.       PT IS ADOPTED- NO MEDICAL HISTORY FOR FAMILY.      Right-handed.      2 cups caffeine per day.   Immunization History  Administered Date(s) Administered  Influenza,inj,Quad PF,6+ Mos 03/26/2014   PFIZER(Purple Top)SARS-COV-2 Vaccination 07/21/2019, 08/08/2019, 04/12/2020   Pneumococcal Conjugate-13 02/19/2014   Pneumococcal Polysaccharide-23 10/30/2014     Objective: Vital Signs: There were no vitals taken for this visit.   Physical Exam   Musculoskeletal Exam: ***  CDAI Exam: CDAI Score: -- Patient Global: --; Provider Global: -- Swollen: --; Tender: -- Joint Exam 01/17/2023   No joint exam has been documented for this visit   There is currently no  information documented on the homunculus. Go to the Rheumatology activity and complete the homunculus joint exam.  Investigation: No additional findings.  Imaging: No results found.  Recent Labs: Lab Results  Component Value Date   WBC 16.7 (H) 08/26/2022   HGB 12.9 (L) 08/26/2022   PLT 215 08/26/2022   NA 136 08/26/2022   K 3.3 (L) 08/26/2022   CL 102 08/26/2022   CO2 26 08/26/2022   GLUCOSE 108 (H) 08/26/2022   BUN 25 (H) 08/26/2022   CREATININE 1.40 (H) 08/26/2022   BILITOT 0.5 02/21/2022   ALKPHOS 95 02/21/2022   AST 20 02/21/2022   ALT 21 02/21/2022   PROT 7.0 02/21/2022   ALBUMIN 4.4 02/21/2022   CALCIUM 8.1 (L) 08/26/2022   GFRAA 67 04/29/2020    Speciality Comments: No specialty comments available.  Procedures:  No procedures performed Allergies: Patient has no known allergies.   Assessment / Plan:     Visit Diagnoses: No diagnosis found.  Orders: No orders of the defined types were placed in this encounter.  No orders of the defined types were placed in this encounter.   Face-to-face time spent with patient was *** minutes. Greater than 50% of time was spent in counseling and coordination of care.  Follow-Up Instructions: No follow-ups on file.   Pollyann Savoy, MD  Note - This record has been created using Animal nutritionist.  Chart creation errors have been sought, but may not always  have been located. Such creation errors do not reflect on  the standard of medical care.

## 2023-01-04 DIAGNOSIS — M25552 Pain in left hip: Secondary | ICD-10-CM | POA: Diagnosis not present

## 2023-01-04 DIAGNOSIS — M9903 Segmental and somatic dysfunction of lumbar region: Secondary | ICD-10-CM | POA: Diagnosis not present

## 2023-01-04 DIAGNOSIS — M5136 Other intervertebral disc degeneration, lumbar region: Secondary | ICD-10-CM | POA: Diagnosis not present

## 2023-01-04 DIAGNOSIS — M9905 Segmental and somatic dysfunction of pelvic region: Secondary | ICD-10-CM | POA: Diagnosis not present

## 2023-01-08 DIAGNOSIS — M5136 Other intervertebral disc degeneration, lumbar region: Secondary | ICD-10-CM | POA: Diagnosis not present

## 2023-01-08 DIAGNOSIS — M25552 Pain in left hip: Secondary | ICD-10-CM | POA: Diagnosis not present

## 2023-01-08 DIAGNOSIS — M9903 Segmental and somatic dysfunction of lumbar region: Secondary | ICD-10-CM | POA: Diagnosis not present

## 2023-01-08 DIAGNOSIS — M9905 Segmental and somatic dysfunction of pelvic region: Secondary | ICD-10-CM | POA: Diagnosis not present

## 2023-01-10 DIAGNOSIS — M25552 Pain in left hip: Secondary | ICD-10-CM | POA: Diagnosis not present

## 2023-01-10 DIAGNOSIS — M9903 Segmental and somatic dysfunction of lumbar region: Secondary | ICD-10-CM | POA: Diagnosis not present

## 2023-01-10 DIAGNOSIS — M9905 Segmental and somatic dysfunction of pelvic region: Secondary | ICD-10-CM | POA: Diagnosis not present

## 2023-01-10 DIAGNOSIS — M5136 Other intervertebral disc degeneration, lumbar region: Secondary | ICD-10-CM | POA: Diagnosis not present

## 2023-01-17 ENCOUNTER — Encounter: Payer: PPO | Admitting: Rheumatology

## 2023-01-17 DIAGNOSIS — C61 Malignant neoplasm of prostate: Secondary | ICD-10-CM

## 2023-01-17 DIAGNOSIS — M1120 Other chondrocalcinosis, unspecified site: Secondary | ICD-10-CM

## 2023-01-17 DIAGNOSIS — E785 Hyperlipidemia, unspecified: Secondary | ICD-10-CM

## 2023-01-17 DIAGNOSIS — M79641 Pain in right hand: Secondary | ICD-10-CM

## 2023-01-17 DIAGNOSIS — I2510 Atherosclerotic heart disease of native coronary artery without angina pectoris: Secondary | ICD-10-CM

## 2023-01-17 DIAGNOSIS — M7542 Impingement syndrome of left shoulder: Secondary | ICD-10-CM

## 2023-01-17 DIAGNOSIS — Z8669 Personal history of other diseases of the nervous system and sense organs: Secondary | ICD-10-CM

## 2023-01-17 DIAGNOSIS — F39 Unspecified mood [affective] disorder: Secondary | ICD-10-CM

## 2023-01-17 DIAGNOSIS — E559 Vitamin D deficiency, unspecified: Secondary | ICD-10-CM

## 2023-01-17 DIAGNOSIS — R7303 Prediabetes: Secondary | ICD-10-CM

## 2023-01-17 DIAGNOSIS — Z8719 Personal history of other diseases of the digestive system: Secondary | ICD-10-CM

## 2023-01-17 DIAGNOSIS — G4733 Obstructive sleep apnea (adult) (pediatric): Secondary | ICD-10-CM

## 2023-01-17 DIAGNOSIS — I10 Essential (primary) hypertension: Secondary | ICD-10-CM

## 2023-01-17 DIAGNOSIS — G8929 Other chronic pain: Secondary | ICD-10-CM

## 2023-01-18 DIAGNOSIS — G4733 Obstructive sleep apnea (adult) (pediatric): Secondary | ICD-10-CM | POA: Diagnosis not present

## 2023-01-22 DIAGNOSIS — M5136 Other intervertebral disc degeneration, lumbar region: Secondary | ICD-10-CM | POA: Diagnosis not present

## 2023-01-22 DIAGNOSIS — M9903 Segmental and somatic dysfunction of lumbar region: Secondary | ICD-10-CM | POA: Diagnosis not present

## 2023-01-22 DIAGNOSIS — M9905 Segmental and somatic dysfunction of pelvic region: Secondary | ICD-10-CM | POA: Diagnosis not present

## 2023-01-22 DIAGNOSIS — M25552 Pain in left hip: Secondary | ICD-10-CM | POA: Diagnosis not present

## 2023-01-25 DIAGNOSIS — M25552 Pain in left hip: Secondary | ICD-10-CM | POA: Diagnosis not present

## 2023-01-25 DIAGNOSIS — M5136 Other intervertebral disc degeneration, lumbar region: Secondary | ICD-10-CM | POA: Diagnosis not present

## 2023-01-25 DIAGNOSIS — M9903 Segmental and somatic dysfunction of lumbar region: Secondary | ICD-10-CM | POA: Diagnosis not present

## 2023-01-25 DIAGNOSIS — M9905 Segmental and somatic dysfunction of pelvic region: Secondary | ICD-10-CM | POA: Diagnosis not present

## 2023-01-29 DIAGNOSIS — M9905 Segmental and somatic dysfunction of pelvic region: Secondary | ICD-10-CM | POA: Diagnosis not present

## 2023-01-29 DIAGNOSIS — M9903 Segmental and somatic dysfunction of lumbar region: Secondary | ICD-10-CM | POA: Diagnosis not present

## 2023-01-29 DIAGNOSIS — M5136 Other intervertebral disc degeneration, lumbar region: Secondary | ICD-10-CM | POA: Diagnosis not present

## 2023-01-29 DIAGNOSIS — M25552 Pain in left hip: Secondary | ICD-10-CM | POA: Diagnosis not present

## 2023-02-05 DIAGNOSIS — M9905 Segmental and somatic dysfunction of pelvic region: Secondary | ICD-10-CM | POA: Diagnosis not present

## 2023-02-05 DIAGNOSIS — M25552 Pain in left hip: Secondary | ICD-10-CM | POA: Diagnosis not present

## 2023-02-05 DIAGNOSIS — M9903 Segmental and somatic dysfunction of lumbar region: Secondary | ICD-10-CM | POA: Diagnosis not present

## 2023-02-05 DIAGNOSIS — M5136 Other intervertebral disc degeneration, lumbar region: Secondary | ICD-10-CM | POA: Diagnosis not present

## 2023-02-06 DIAGNOSIS — G8929 Other chronic pain: Secondary | ICD-10-CM | POA: Diagnosis not present

## 2023-02-06 DIAGNOSIS — Z6831 Body mass index (BMI) 31.0-31.9, adult: Secondary | ICD-10-CM | POA: Diagnosis not present

## 2023-02-08 DIAGNOSIS — M25552 Pain in left hip: Secondary | ICD-10-CM | POA: Diagnosis not present

## 2023-02-08 DIAGNOSIS — M9903 Segmental and somatic dysfunction of lumbar region: Secondary | ICD-10-CM | POA: Diagnosis not present

## 2023-02-08 DIAGNOSIS — M9905 Segmental and somatic dysfunction of pelvic region: Secondary | ICD-10-CM | POA: Diagnosis not present

## 2023-02-08 DIAGNOSIS — M5136 Other intervertebral disc degeneration, lumbar region: Secondary | ICD-10-CM | POA: Diagnosis not present

## 2023-02-12 DIAGNOSIS — R102 Pelvic and perineal pain: Secondary | ICD-10-CM | POA: Diagnosis not present

## 2023-02-12 DIAGNOSIS — M545 Low back pain, unspecified: Secondary | ICD-10-CM | POA: Diagnosis not present

## 2023-02-13 ENCOUNTER — Ambulatory Visit: Payer: PPO | Admitting: Rheumatology

## 2023-02-14 DIAGNOSIS — G44219 Episodic tension-type headache, not intractable: Secondary | ICD-10-CM | POA: Diagnosis not present

## 2023-02-18 DIAGNOSIS — G4733 Obstructive sleep apnea (adult) (pediatric): Secondary | ICD-10-CM | POA: Diagnosis not present

## 2023-02-21 DIAGNOSIS — M545 Low back pain, unspecified: Secondary | ICD-10-CM | POA: Diagnosis not present

## 2023-02-21 DIAGNOSIS — R102 Pelvic and perineal pain: Secondary | ICD-10-CM | POA: Diagnosis not present

## 2023-02-26 DIAGNOSIS — R102 Pelvic and perineal pain: Secondary | ICD-10-CM | POA: Diagnosis not present

## 2023-02-26 DIAGNOSIS — M545 Low back pain, unspecified: Secondary | ICD-10-CM | POA: Diagnosis not present

## 2023-03-26 DIAGNOSIS — K219 Gastro-esophageal reflux disease without esophagitis: Secondary | ICD-10-CM | POA: Diagnosis not present

## 2023-04-17 DIAGNOSIS — M1811 Unilateral primary osteoarthritis of first carpometacarpal joint, right hand: Secondary | ICD-10-CM | POA: Diagnosis not present

## 2023-05-10 DIAGNOSIS — C61 Malignant neoplasm of prostate: Secondary | ICD-10-CM | POA: Diagnosis not present

## 2023-05-17 DIAGNOSIS — R3915 Urgency of urination: Secondary | ICD-10-CM | POA: Diagnosis not present

## 2023-05-17 DIAGNOSIS — N5201 Erectile dysfunction due to arterial insufficiency: Secondary | ICD-10-CM | POA: Diagnosis not present

## 2023-05-17 DIAGNOSIS — C61 Malignant neoplasm of prostate: Secondary | ICD-10-CM | POA: Diagnosis not present

## 2023-07-03 DIAGNOSIS — M47816 Spondylosis without myelopathy or radiculopathy, lumbar region: Secondary | ICD-10-CM | POA: Diagnosis not present

## 2023-07-06 DIAGNOSIS — S39012A Strain of muscle, fascia and tendon of lower back, initial encounter: Secondary | ICD-10-CM | POA: Diagnosis not present

## 2023-07-25 DIAGNOSIS — G4733 Obstructive sleep apnea (adult) (pediatric): Secondary | ICD-10-CM | POA: Diagnosis not present

## 2023-08-02 DIAGNOSIS — E7800 Pure hypercholesterolemia, unspecified: Secondary | ICD-10-CM | POA: Diagnosis not present

## 2023-08-02 DIAGNOSIS — Z125 Encounter for screening for malignant neoplasm of prostate: Secondary | ICD-10-CM | POA: Diagnosis not present

## 2023-08-02 DIAGNOSIS — I10 Essential (primary) hypertension: Secondary | ICD-10-CM | POA: Diagnosis not present

## 2023-08-06 DIAGNOSIS — D2271 Melanocytic nevi of right lower limb, including hip: Secondary | ICD-10-CM | POA: Diagnosis not present

## 2023-08-06 DIAGNOSIS — D2272 Melanocytic nevi of left lower limb, including hip: Secondary | ICD-10-CM | POA: Diagnosis not present

## 2023-08-06 DIAGNOSIS — L821 Other seborrheic keratosis: Secondary | ICD-10-CM | POA: Diagnosis not present

## 2023-08-06 DIAGNOSIS — D225 Melanocytic nevi of trunk: Secondary | ICD-10-CM | POA: Diagnosis not present

## 2023-08-06 DIAGNOSIS — L570 Actinic keratosis: Secondary | ICD-10-CM | POA: Diagnosis not present

## 2023-08-06 DIAGNOSIS — D171 Benign lipomatous neoplasm of skin and subcutaneous tissue of trunk: Secondary | ICD-10-CM | POA: Diagnosis not present

## 2023-08-06 DIAGNOSIS — L905 Scar conditions and fibrosis of skin: Secondary | ICD-10-CM | POA: Diagnosis not present

## 2023-08-14 DIAGNOSIS — G4733 Obstructive sleep apnea (adult) (pediatric): Secondary | ICD-10-CM | POA: Diagnosis not present

## 2023-08-15 DIAGNOSIS — Z0000 Encounter for general adult medical examination without abnormal findings: Secondary | ICD-10-CM | POA: Diagnosis not present

## 2023-08-15 DIAGNOSIS — C61 Malignant neoplasm of prostate: Secondary | ICD-10-CM | POA: Diagnosis not present

## 2023-08-15 DIAGNOSIS — R931 Abnormal findings on diagnostic imaging of heart and coronary circulation: Secondary | ICD-10-CM | POA: Diagnosis not present

## 2023-08-15 DIAGNOSIS — N400 Enlarged prostate without lower urinary tract symptoms: Secondary | ICD-10-CM | POA: Diagnosis not present

## 2023-08-15 DIAGNOSIS — G2581 Restless legs syndrome: Secondary | ICD-10-CM | POA: Diagnosis not present

## 2023-08-15 DIAGNOSIS — K219 Gastro-esophageal reflux disease without esophagitis: Secondary | ICD-10-CM | POA: Diagnosis not present

## 2023-08-15 DIAGNOSIS — G43109 Migraine with aura, not intractable, without status migrainosus: Secondary | ICD-10-CM | POA: Diagnosis not present

## 2023-08-15 DIAGNOSIS — R7989 Other specified abnormal findings of blood chemistry: Secondary | ICD-10-CM | POA: Diagnosis not present

## 2023-08-15 DIAGNOSIS — F5104 Psychophysiologic insomnia: Secondary | ICD-10-CM | POA: Diagnosis not present

## 2023-08-15 DIAGNOSIS — M25473 Effusion, unspecified ankle: Secondary | ICD-10-CM | POA: Diagnosis not present

## 2023-08-15 DIAGNOSIS — I10 Essential (primary) hypertension: Secondary | ICD-10-CM | POA: Diagnosis not present

## 2023-08-15 DIAGNOSIS — E782 Mixed hyperlipidemia: Secondary | ICD-10-CM | POA: Diagnosis not present

## 2023-08-22 DIAGNOSIS — R7989 Other specified abnormal findings of blood chemistry: Secondary | ICD-10-CM | POA: Diagnosis not present

## 2023-10-16 DIAGNOSIS — G4719 Other hypersomnia: Secondary | ICD-10-CM | POA: Diagnosis not present

## 2023-10-16 DIAGNOSIS — G4733 Obstructive sleep apnea (adult) (pediatric): Secondary | ICD-10-CM | POA: Diagnosis not present

## 2023-11-01 DIAGNOSIS — E66811 Obesity, class 1: Secondary | ICD-10-CM | POA: Diagnosis not present

## 2023-11-01 DIAGNOSIS — N1831 Chronic kidney disease, stage 3a: Secondary | ICD-10-CM | POA: Diagnosis not present

## 2023-11-01 DIAGNOSIS — G2581 Restless legs syndrome: Secondary | ICD-10-CM | POA: Diagnosis not present

## 2023-11-01 DIAGNOSIS — I499 Cardiac arrhythmia, unspecified: Secondary | ICD-10-CM | POA: Diagnosis not present

## 2023-11-01 DIAGNOSIS — Z1331 Encounter for screening for depression: Secondary | ICD-10-CM | POA: Diagnosis not present

## 2023-11-01 DIAGNOSIS — G4733 Obstructive sleep apnea (adult) (pediatric): Secondary | ICD-10-CM | POA: Diagnosis not present

## 2023-11-01 DIAGNOSIS — R7303 Prediabetes: Secondary | ICD-10-CM | POA: Diagnosis not present

## 2023-11-01 DIAGNOSIS — Z79899 Other long term (current) drug therapy: Secondary | ICD-10-CM | POA: Diagnosis not present

## 2023-11-01 DIAGNOSIS — G4719 Other hypersomnia: Secondary | ICD-10-CM | POA: Diagnosis not present

## 2023-11-01 DIAGNOSIS — F419 Anxiety disorder, unspecified: Secondary | ICD-10-CM | POA: Diagnosis not present

## 2023-11-01 DIAGNOSIS — E782 Mixed hyperlipidemia: Secondary | ICD-10-CM | POA: Diagnosis not present

## 2023-11-01 DIAGNOSIS — C61 Malignant neoplasm of prostate: Secondary | ICD-10-CM | POA: Diagnosis not present

## 2023-11-01 DIAGNOSIS — Z6833 Body mass index (BMI) 33.0-33.9, adult: Secondary | ICD-10-CM | POA: Diagnosis not present

## 2023-11-06 DIAGNOSIS — C61 Malignant neoplasm of prostate: Secondary | ICD-10-CM | POA: Diagnosis not present

## 2023-11-13 DIAGNOSIS — R338 Other retention of urine: Secondary | ICD-10-CM | POA: Diagnosis not present

## 2023-11-13 DIAGNOSIS — M1811 Unilateral primary osteoarthritis of first carpometacarpal joint, right hand: Secondary | ICD-10-CM | POA: Diagnosis not present

## 2023-11-13 DIAGNOSIS — C61 Malignant neoplasm of prostate: Secondary | ICD-10-CM | POA: Diagnosis not present

## 2023-11-13 DIAGNOSIS — N5201 Erectile dysfunction due to arterial insufficiency: Secondary | ICD-10-CM | POA: Diagnosis not present

## 2023-11-15 DIAGNOSIS — G2581 Restless legs syndrome: Secondary | ICD-10-CM | POA: Diagnosis not present

## 2023-11-15 DIAGNOSIS — I499 Cardiac arrhythmia, unspecified: Secondary | ICD-10-CM | POA: Diagnosis not present

## 2023-11-15 DIAGNOSIS — R7303 Prediabetes: Secondary | ICD-10-CM | POA: Diagnosis not present

## 2023-11-15 DIAGNOSIS — Z6832 Body mass index (BMI) 32.0-32.9, adult: Secondary | ICD-10-CM | POA: Diagnosis not present

## 2023-11-15 DIAGNOSIS — F419 Anxiety disorder, unspecified: Secondary | ICD-10-CM | POA: Diagnosis not present

## 2023-11-15 DIAGNOSIS — E66811 Obesity, class 1: Secondary | ICD-10-CM | POA: Diagnosis not present

## 2023-11-15 DIAGNOSIS — N1831 Chronic kidney disease, stage 3a: Secondary | ICD-10-CM | POA: Diagnosis not present

## 2023-11-15 DIAGNOSIS — E782 Mixed hyperlipidemia: Secondary | ICD-10-CM | POA: Diagnosis not present

## 2023-11-15 DIAGNOSIS — G4733 Obstructive sleep apnea (adult) (pediatric): Secondary | ICD-10-CM | POA: Diagnosis not present

## 2023-11-15 DIAGNOSIS — K219 Gastro-esophageal reflux disease without esophagitis: Secondary | ICD-10-CM | POA: Diagnosis not present

## 2023-11-29 DIAGNOSIS — K219 Gastro-esophageal reflux disease without esophagitis: Secondary | ICD-10-CM | POA: Diagnosis not present

## 2023-11-29 DIAGNOSIS — F419 Anxiety disorder, unspecified: Secondary | ICD-10-CM | POA: Diagnosis not present

## 2023-11-29 DIAGNOSIS — E66811 Obesity, class 1: Secondary | ICD-10-CM | POA: Diagnosis not present

## 2023-11-29 DIAGNOSIS — G4733 Obstructive sleep apnea (adult) (pediatric): Secondary | ICD-10-CM | POA: Diagnosis not present

## 2023-11-29 DIAGNOSIS — N1831 Chronic kidney disease, stage 3a: Secondary | ICD-10-CM | POA: Diagnosis not present

## 2023-11-29 DIAGNOSIS — E782 Mixed hyperlipidemia: Secondary | ICD-10-CM | POA: Diagnosis not present

## 2023-11-29 DIAGNOSIS — G2581 Restless legs syndrome: Secondary | ICD-10-CM | POA: Diagnosis not present

## 2023-11-29 DIAGNOSIS — R7303 Prediabetes: Secondary | ICD-10-CM | POA: Diagnosis not present

## 2023-11-29 DIAGNOSIS — Z6831 Body mass index (BMI) 31.0-31.9, adult: Secondary | ICD-10-CM | POA: Diagnosis not present

## 2023-12-17 DIAGNOSIS — J329 Chronic sinusitis, unspecified: Secondary | ICD-10-CM | POA: Diagnosis not present

## 2023-12-17 DIAGNOSIS — R1110 Vomiting, unspecified: Secondary | ICD-10-CM | POA: Diagnosis not present

## 2023-12-31 DIAGNOSIS — F419 Anxiety disorder, unspecified: Secondary | ICD-10-CM | POA: Diagnosis not present

## 2023-12-31 DIAGNOSIS — E782 Mixed hyperlipidemia: Secondary | ICD-10-CM | POA: Diagnosis not present

## 2023-12-31 DIAGNOSIS — K219 Gastro-esophageal reflux disease without esophagitis: Secondary | ICD-10-CM | POA: Diagnosis not present

## 2023-12-31 DIAGNOSIS — N1831 Chronic kidney disease, stage 3a: Secondary | ICD-10-CM | POA: Diagnosis not present

## 2023-12-31 DIAGNOSIS — G4733 Obstructive sleep apnea (adult) (pediatric): Secondary | ICD-10-CM | POA: Diagnosis not present

## 2023-12-31 DIAGNOSIS — R7303 Prediabetes: Secondary | ICD-10-CM | POA: Diagnosis not present

## 2023-12-31 DIAGNOSIS — E66811 Obesity, class 1: Secondary | ICD-10-CM | POA: Diagnosis not present

## 2023-12-31 DIAGNOSIS — G2581 Restless legs syndrome: Secondary | ICD-10-CM | POA: Diagnosis not present

## 2023-12-31 DIAGNOSIS — Z6830 Body mass index (BMI) 30.0-30.9, adult: Secondary | ICD-10-CM | POA: Diagnosis not present

## 2024-01-02 DIAGNOSIS — R42 Dizziness and giddiness: Secondary | ICD-10-CM | POA: Diagnosis not present

## 2024-01-02 DIAGNOSIS — I959 Hypotension, unspecified: Secondary | ICD-10-CM | POA: Diagnosis not present

## 2024-01-02 DIAGNOSIS — M25473 Effusion, unspecified ankle: Secondary | ICD-10-CM | POA: Diagnosis not present

## 2024-01-10 DIAGNOSIS — E876 Hypokalemia: Secondary | ICD-10-CM | POA: Diagnosis not present

## 2024-01-18 DIAGNOSIS — Z961 Presence of intraocular lens: Secondary | ICD-10-CM | POA: Diagnosis not present

## 2024-01-18 DIAGNOSIS — H11153 Pinguecula, bilateral: Secondary | ICD-10-CM | POA: Diagnosis not present

## 2024-01-21 DIAGNOSIS — Z6830 Body mass index (BMI) 30.0-30.9, adult: Secondary | ICD-10-CM | POA: Diagnosis not present

## 2024-01-21 DIAGNOSIS — N1831 Chronic kidney disease, stage 3a: Secondary | ICD-10-CM | POA: Diagnosis not present

## 2024-01-21 DIAGNOSIS — E782 Mixed hyperlipidemia: Secondary | ICD-10-CM | POA: Diagnosis not present

## 2024-01-21 DIAGNOSIS — K219 Gastro-esophageal reflux disease without esophagitis: Secondary | ICD-10-CM | POA: Diagnosis not present

## 2024-01-21 DIAGNOSIS — F419 Anxiety disorder, unspecified: Secondary | ICD-10-CM | POA: Diagnosis not present

## 2024-01-21 DIAGNOSIS — R7303 Prediabetes: Secondary | ICD-10-CM | POA: Diagnosis not present

## 2024-01-21 DIAGNOSIS — G2581 Restless legs syndrome: Secondary | ICD-10-CM | POA: Diagnosis not present

## 2024-01-21 DIAGNOSIS — E66811 Obesity, class 1: Secondary | ICD-10-CM | POA: Diagnosis not present

## 2024-01-21 DIAGNOSIS — G4733 Obstructive sleep apnea (adult) (pediatric): Secondary | ICD-10-CM | POA: Diagnosis not present

## 2024-02-21 ENCOUNTER — Encounter: Payer: Self-pay | Admitting: Cardiology

## 2024-02-21 NOTE — Progress Notes (Unsigned)
 Cardiology Office Note   Date:  02/22/2024   ID:  Dyllan, Hughett 02/17/1946, MRN [id number]  PCP:  Clarice Nottingham, MD  Cardiologist:   Lynwood Schilling, MD Referring:  Clarice Nottingham, MD  Chief Complaint  Patient presents with   Hypotension      History of Present Illness: Adrian Skinner is a 78 y.o. male who presents for follow up of hypotension.  I have not seen him since 2022.  He had a history of chest discomfort in 2015 and had only very minimal LAD plaque on cardiac cath.   His last cardiac procedure was an echo in 2022 that was low normal.  He still working full-time 50 hours a week.   I saw the patient on 11/06/2013 with chest pain. His symptom was concerning for obstructive coronary artery disease. Stress test obtained on 11/07/2013 was intermediate risk with relatively small area of mild inferoseptal ischemia. He underwent cardiac catheterization on 11/11/2013 which showed EF 55-65%, 10-20% mid LAD lesion, no other significant coronary artery disease.  He has not had any problems since I saw him until more recently when he had some episodes of hypotension.  He had an episode at the beach.  He felt very dizzy.  He went to an urgent care and I cannot see these results.  His blood pressure was 88/58.  He was given antibiotics but he is not clear that he had any infection.  He was hydrated.  He said he has always been sensitive to the weather and changes in barometric pressure.  He has noticed more dizziness on standing.  He felt dizzy that day and had to stop golfing.  He had some dizziness bending over and standing up.  He has lost 22 pounds.  He has remained on the meds as listed including his triamterene hydrochlorothiazide.  Does not take Lasix .  He does the triamterene hydrochlorothiazide because he has lower extremity swelling if he does not take it.  This has been chronic.  He also has to take tamsulosin for his prostate.  He is not describe chest discomfort, neck or arm  discomfort.  He has had no shortness of breath.  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   Past Medical History:  Diagnosis Date   Anemia    Anxiety    Bilateral swelling of feet    Cataract    Environmental and seasonal allergies    Essential hypertension    GERD (gastroesophageal reflux disease)    Hiatal hernia    High blood pressure    Hyperlipidemia    Insomnia    Migraine without status migrainosus, not intractable    OSA (obstructive sleep apnea) 11/25/2014   Reflux    RLS (restless legs syndrome)    Sleep apnea    Vertigo     Past Surgical History:  Procedure Laterality Date   FOREARM SURGERY     LEFT HEART CATHETERIZATION WITH CORONARY ANGIOGRAM N/A 11/11/2013   Procedure: LEFT HEART CATHETERIZATION WITH CORONARY ANGIOGRAM;  Surgeon: Peter M Swaziland, MD;  Location: [facility] CATH LAB;  Service: Cardiovascular;  Laterality: N/A;   NASAL SINUS SURGERY     RADIOACTIVE SEED IMPLANT N/A 08/11/2022   Procedure: RADIOACTIVE SEED IMPLANT/BRACHYTHERAPY IMPLANT;  Surgeon: Alvaro Hummer, MD;  Location: [facility];  Service: Urology;  Laterality: N/A;  90 MINS   ROTATOR CUFF REPAIR Right    right  SPACE OAR INSTILLATION N/A 08/11/2022   Procedure: SPACE OAR INSTILLATION;  Surgeon: Alvaro Hummer, MD;  Location: [facility];  Service: Urology;  Laterality: N/A;   TONSILLECTOMY AND ADENOIDECTOMY       Current Outpatient Medications  Medication Sig Dispense Refill   Evolocumab (REPATHA SURECLICK) 140 MG/ML SOAJ Inject 140 mg as directed every 30 (thirty) days.     furosemide  (LASIX ) 20 MG tablet Take 1 tablet (20 mg total) by mouth as needed for edema. 90 tablet 0   omeprazole (PRILOSEC) 40 MG capsule Take by mouth.     pramipexole (MIRAPEX) 0.5 MG tablet Take by mouth. Take 2 at bedtime     sertraline (ZOLOFT) 25 MG tablet Take 25 mg by mouth daily.     tamsulosin (FLOMAX) 0.4 MG CAPS capsule Take 1  capsule by mouth 2 (two) times daily.     triamterene-hydrochlorothiazide (MAXZIDE-25) 37.5-25 MG tablet Take 1 tablet by mouth daily.     No current facility-administered medications for this visit.    Allergies:   Patient has no known allergies.    Social History:  The patient  reports that he has never smoked. He has never used smokeless tobacco. He reports current alcohol use. He reports that he does not use drugs.   Family History:  The patient's He was adopted. Family history is unknown by patient.    ROS:  Please see the history of present illness.   Otherwise, review of systems are positive for none.   All other systems are reviewed and negative.    PHYSICAL EXAM: VS:  BP 124/77   Pulse 83   Ht 5' 10 (1.778 m)   Wt 205 lb (93 kg)   SpO2 96%   BMI 29.41 kg/m  , BMI Body mass index is 29.41 kg/m. GENERAL:  Well appearing HEENT:  Pupils equal round and reactive, fundi not visualized, oral mucosa unremarkable NECK:  No jugular venous distention, waveform within normal limits, carotid upstroke brisk and symmetric, no bruits, no thyromegaly LYMPHATICS:  No cervical, inguinal adenopathy LUNGS:  Clear to auscultation bilaterally BACK:  No CVA tenderness CHEST:  Unremarkable HEART:  PMI not displaced or sustained,S1 and S2 within normal limits, no S3, no S4, no clicks, no rubs, no murmurs ABD:  Flat, positive bowel sounds normal in frequency in pitch, no bruits, no rebound, no guarding, no midline pulsatile mass, no hepatomegaly, no splenomegaly EXT:  2 plus pulses throughout, no edema, no cyanosis no clubbing SKIN:  No rashes no nodules NEURO:  Cranial nerves II through XII grossly intact, motor grossly intact throughout [facility]:  Cognitively intact, oriented to person place and time    EKG:  EKG Interpretation Date/Time:  Friday February 22 2024 16:23:51 EDT Ventricular Rate:  83 PR Interval:  178 QRS Duration:  94 QT Interval:  396 QTC Calculation: 465 R  Axis:   43  Text Interpretation: Normal sinus rhythm Possible Anterior infarct , age undetermined Borderline QT prolongation Non-specific ST-t changes When compared with ECG of 09-Nov-2013 13:44, Poor anterior R wave progression is now present Confirmed by Lavona Agent ([id number]) on 02/22/2024 4:41:19 PM     Recent Labs: No results found for requested labs within last 365 days.    Lipid Panel    Component Value Date/Time   CHOL 191 02/21/2022 0954   TRIG 385 (H) 02/21/2022 0954   HDL 46 02/21/2022 0954   CHOLHDL 4.2 04/29/2020 1055   LDLCALC 83 02/21/2022 0954  Wt Readings from Last 3 Encounters:  02/22/24 205 lb (93 kg)  08/31/22 238 lb (108 kg)  08/11/22 239 lb 4.8 oz (108.5 kg)      Other studies Reviewed: Additional studies/ records that were reviewed today include: Labs. Review of the above records demonstrates:  Please see elsewhere in the note.     ASSESSMENT AND PLAN:    Hypotension: We had a long discussion about this.  I suspect he has got some orthostasis.  He was very mildly orthostatic in the office.  I think his symptoms are exacerbated by his medications.  We talked about trying to cut his triamterene hydrochlorothiazide in half.  He thinks he needs some diuretic.  Ultimately we might be able to discontinue this and just use as needed Lasix .  He is going to try the reduced Maxide.  He might need eventually to be off of the tamsulosin.  If these conservative therapies do not work along with the compression stockings that he is wearing I would consider further evaluation.  Obstructive sleep apnea: He is no longer using CPAP as he has lost quite a bit of weight.   Current medicines are reviewed at length with the patient today.  The patient does not have concerns regarding medicines.  The following changes have been made: As above  Labs/ tests ordered today include:   Orders Placed This Encounter  Procedures   EKG 12-Lead     Disposition:   FU with  me as needed.     Signed, Lynwood Schilling, MD  02/22/2024 5:36 PM    [facility]

## 2024-02-22 ENCOUNTER — Ambulatory Visit: Admitting: Cardiology

## 2024-02-22 ENCOUNTER — Other Ambulatory Visit (HOSPITAL_COMMUNITY): Payer: Self-pay

## 2024-02-22 ENCOUNTER — Encounter: Payer: Self-pay | Admitting: Cardiology

## 2024-02-22 ENCOUNTER — Ambulatory Visit: Attending: Cardiology | Admitting: Cardiology

## 2024-02-22 VITALS — BP 124/77 | HR 83 | Ht 70.0 in | Wt 205.0 lb

## 2024-02-22 DIAGNOSIS — I959 Hypotension, unspecified: Secondary | ICD-10-CM | POA: Diagnosis not present

## 2024-02-22 MED ORDER — FUROSEMIDE 20 MG PO TABS
20.0000 mg | ORAL_TABLET | ORAL | 0 refills | Status: AC | PRN
  Filled 2024-02-22: qty 90, 90d supply, fill #0

## 2024-02-22 NOTE — Patient Instructions (Signed)
 Medication Instructions:  Lasix  20 mg as needed swelling *If you need a refill on your cardiac medications before your next appointment, please call your pharmacy*  Lab Work: NONE If you have labs (blood work) drawn today and your tests are completely normal, you will receive your results only by: MyChart Message (if you have MyChart) OR A paper copy in the mail If you have any lab test that is abnormal or we need to change your treatment, we will call you to review the results.  Testing/Procedures: NONE  Follow-Up: At [facility], you and your health needs are our priority.  As part of our continuing mission to provide you with exceptional heart care, our providers are all part of one team.  This team includes your primary Cardiologist (physician) and Advanced Practice Providers or APPs (Physician Assistants and Nurse Practitioners) who all work together to provide you with the care you need, when you need it.  Your next appointment:   As needed  Provider:   [place], MD  We recommend signing up for the patient portal called MyChart.  Sign up information is provided on this After Visit Summary.  MyChart is used to connect with patients for Virtual Visits (Telemedicine).  Patients are able to view lab/test results, encounter notes, upcoming appointments, etc.  Non-urgent messages can be sent to your provider as well.   To learn more about what you can do with MyChart, go to [email or web address].

## 2024-03-03 DIAGNOSIS — E782 Mixed hyperlipidemia: Secondary | ICD-10-CM | POA: Diagnosis not present

## 2024-03-03 DIAGNOSIS — N1831 Chronic kidney disease, stage 3a: Secondary | ICD-10-CM | POA: Diagnosis not present

## 2024-03-03 DIAGNOSIS — E66811 Obesity, class 1: Secondary | ICD-10-CM | POA: Diagnosis not present

## 2024-03-03 DIAGNOSIS — R7303 Prediabetes: Secondary | ICD-10-CM | POA: Diagnosis not present

## 2024-03-03 DIAGNOSIS — G4733 Obstructive sleep apnea (adult) (pediatric): Secondary | ICD-10-CM | POA: Diagnosis not present

## 2024-03-03 DIAGNOSIS — Z6830 Body mass index (BMI) 30.0-30.9, adult: Secondary | ICD-10-CM | POA: Diagnosis not present

## 2024-03-03 DIAGNOSIS — F419 Anxiety disorder, unspecified: Secondary | ICD-10-CM | POA: Diagnosis not present

## 2024-03-03 DIAGNOSIS — K219 Gastro-esophageal reflux disease without esophagitis: Secondary | ICD-10-CM | POA: Diagnosis not present

## 2024-03-03 DIAGNOSIS — G2581 Restless legs syndrome: Secondary | ICD-10-CM | POA: Diagnosis not present

## 2024-03-04 ENCOUNTER — Other Ambulatory Visit (HOSPITAL_COMMUNITY): Payer: Self-pay

## 2024-03-06 DIAGNOSIS — M180 Bilateral primary osteoarthritis of first carpometacarpal joints: Secondary | ICD-10-CM | POA: Diagnosis not present

## 2024-03-06 DIAGNOSIS — M1812 Unilateral primary osteoarthritis of first carpometacarpal joint, left hand: Secondary | ICD-10-CM | POA: Diagnosis not present

## 2024-04-12 DIAGNOSIS — J069 Acute upper respiratory infection, unspecified: Secondary | ICD-10-CM | POA: Diagnosis not present

## 2024-04-12 DIAGNOSIS — Z20822 Contact with and (suspected) exposure to covid-19: Secondary | ICD-10-CM | POA: Diagnosis not present

## 2024-04-14 DIAGNOSIS — R7303 Prediabetes: Secondary | ICD-10-CM | POA: Diagnosis not present

## 2024-04-14 DIAGNOSIS — E782 Mixed hyperlipidemia: Secondary | ICD-10-CM | POA: Diagnosis not present

## 2024-04-14 DIAGNOSIS — E66811 Obesity, class 1: Secondary | ICD-10-CM | POA: Diagnosis not present

## 2024-04-14 DIAGNOSIS — G4733 Obstructive sleep apnea (adult) (pediatric): Secondary | ICD-10-CM | POA: Diagnosis not present

## 2024-04-14 DIAGNOSIS — K219 Gastro-esophageal reflux disease without esophagitis: Secondary | ICD-10-CM | POA: Diagnosis not present

## 2024-04-14 DIAGNOSIS — F419 Anxiety disorder, unspecified: Secondary | ICD-10-CM | POA: Diagnosis not present

## 2024-04-14 DIAGNOSIS — G2581 Restless legs syndrome: Secondary | ICD-10-CM | POA: Diagnosis not present

## 2024-04-14 DIAGNOSIS — N1831 Chronic kidney disease, stage 3a: Secondary | ICD-10-CM | POA: Diagnosis not present

## 2024-04-14 DIAGNOSIS — Z6830 Body mass index (BMI) 30.0-30.9, adult: Secondary | ICD-10-CM | POA: Diagnosis not present

## 2024-05-01 DIAGNOSIS — J0110 Acute frontal sinusitis, unspecified: Secondary | ICD-10-CM | POA: Diagnosis not present

## 2024-05-04 DIAGNOSIS — J329 Chronic sinusitis, unspecified: Secondary | ICD-10-CM | POA: Diagnosis not present

## 2024-05-06 DIAGNOSIS — C61 Malignant neoplasm of prostate: Secondary | ICD-10-CM | POA: Diagnosis not present

## 2024-05-13 DIAGNOSIS — R3915 Urgency of urination: Secondary | ICD-10-CM | POA: Diagnosis not present

## 2024-05-13 DIAGNOSIS — C61 Malignant neoplasm of prostate: Secondary | ICD-10-CM | POA: Diagnosis not present

## 2024-05-20 DIAGNOSIS — J069 Acute upper respiratory infection, unspecified: Secondary | ICD-10-CM | POA: Diagnosis not present

## 2024-05-23 DIAGNOSIS — H6503 Acute serous otitis media, bilateral: Secondary | ICD-10-CM | POA: Diagnosis not present

## 2024-05-23 DIAGNOSIS — J019 Acute sinusitis, unspecified: Secondary | ICD-10-CM | POA: Diagnosis not present

## 2024-06-02 DIAGNOSIS — F419 Anxiety disorder, unspecified: Secondary | ICD-10-CM | POA: Diagnosis not present

## 2024-06-02 DIAGNOSIS — Z6829 Body mass index (BMI) 29.0-29.9, adult: Secondary | ICD-10-CM | POA: Diagnosis not present

## 2024-06-02 DIAGNOSIS — G4733 Obstructive sleep apnea (adult) (pediatric): Secondary | ICD-10-CM | POA: Diagnosis not present

## 2024-06-02 DIAGNOSIS — K219 Gastro-esophageal reflux disease without esophagitis: Secondary | ICD-10-CM | POA: Diagnosis not present

## 2024-06-02 DIAGNOSIS — N1831 Chronic kidney disease, stage 3a: Secondary | ICD-10-CM | POA: Diagnosis not present

## 2024-06-02 DIAGNOSIS — G2581 Restless legs syndrome: Secondary | ICD-10-CM | POA: Diagnosis not present

## 2024-06-02 DIAGNOSIS — E782 Mixed hyperlipidemia: Secondary | ICD-10-CM | POA: Diagnosis not present

## 2024-06-02 DIAGNOSIS — E66811 Obesity, class 1: Secondary | ICD-10-CM | POA: Diagnosis not present

## 2024-06-02 DIAGNOSIS — R7303 Prediabetes: Secondary | ICD-10-CM | POA: Diagnosis not present

## 2024-06-04 DIAGNOSIS — J019 Acute sinusitis, unspecified: Secondary | ICD-10-CM | POA: Diagnosis not present

## 2024-06-04 DIAGNOSIS — G44219 Episodic tension-type headache, not intractable: Secondary | ICD-10-CM | POA: Diagnosis not present
